# Patient Record
Sex: Female | Born: 1990
Health system: Southern US, Community
[De-identification: ages and names within clinical notes are randomized; demographics above are authoritative.]

## PROBLEM LIST (undated history)

## (undated) DIAGNOSIS — D649 Anemia, unspecified: Secondary | ICD-10-CM

## (undated) HISTORY — PX: TONSILLECTOMY: SUR1361

## (undated) HISTORY — PX: DILATION AND CURETTAGE OF UTERUS: SHX78

## (undated) HISTORY — PX: OTHER SURGICAL HISTORY: SHX169

---
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
## 2010-10-30 ENCOUNTER — Emergency Department (HOSPITAL_COMMUNITY)
Admission: EM | Admit: 2010-10-30 | Discharge: 2010-10-30 | Discharge status: Against Medical Advice | Payer: Self-pay | Attending: Emergency Medicine | Admitting: Emergency Medicine

## 2010-10-30 ENCOUNTER — Emergency Department (HOSPITAL_COMMUNITY)
Admission: EM | Admit: 2010-10-30 | Discharge: 2010-10-31 | Disposition: A | Discharge status: Home / Self Care | Payer: Self-pay | Attending: Emergency Medicine | Admitting: Emergency Medicine

## 2010-10-30 DIAGNOSIS — R110 Nausea: Secondary | ICD-10-CM | POA: Insufficient documentation

## 2010-10-30 DIAGNOSIS — R1031 Right lower quadrant pain: Secondary | ICD-10-CM | POA: Insufficient documentation

## 2010-10-30 DIAGNOSIS — R197 Diarrhea, unspecified: Secondary | ICD-10-CM | POA: Insufficient documentation

## 2010-10-30 DIAGNOSIS — N390 Urinary tract infection, site not specified: Secondary | ICD-10-CM | POA: Insufficient documentation

## 2010-10-30 DIAGNOSIS — K5900 Constipation, unspecified: Secondary | ICD-10-CM | POA: Insufficient documentation

## 2010-10-30 DIAGNOSIS — R10813 Right lower quadrant abdominal tenderness: Secondary | ICD-10-CM | POA: Insufficient documentation

## 2010-10-30 LAB — URINALYSIS, ROUTINE W REFLEX MICROSCOPIC
Bilirubin Urine: NEGATIVE
Glucose, UA: NEGATIVE mg/dL
Ketones, ur: NEGATIVE mg/dL
Nitrite: POSITIVE — AB
Protein, ur: NEGATIVE mg/dL
Specific Gravity, Urine: 1.017 (ref 1.005–1.030)
Urobilinogen, UA: 0.2 mg/dL (ref 0.0–1.0)
pH: 6 (ref 5.0–8.0)

## 2010-10-30 LAB — URINE MICROSCOPIC-ADD ON

## 2010-10-31 ENCOUNTER — Emergency Department (HOSPITAL_COMMUNITY): Payer: Self-pay

## 2010-10-31 ENCOUNTER — Encounter (HOSPITAL_COMMUNITY): Payer: Self-pay | Admitting: Radiology

## 2010-10-31 LAB — COMPREHENSIVE METABOLIC PANEL
ALT: 11 U/L (ref 0–35)
AST: 12 U/L (ref 0–37)
Albumin: 4.2 g/dL (ref 3.5–5.2)
Alkaline Phosphatase: 43 U/L (ref 39–117)
BUN: 14 mg/dL (ref 6–23)
CO2: 25 mEq/L (ref 19–32)
Calcium: 9.3 mg/dL (ref 8.4–10.5)
Chloride: 107 mEq/L (ref 96–112)
Creatinine, Ser: 0.71 mg/dL (ref 0.50–1.10)
GFR calc Af Amer: >60 mL/min (ref >60)
GFR calc non Af Amer: >60 mL/min (ref >60)
Glucose, Bld: 91 mg/dL (ref 70–99)
Potassium: 3.2 mEq/L — ABNORMAL LOW (ref 3.5–5.1)
Sodium: 140 mEq/L (ref 135–145)
Total Bilirubin: 0.4 mg/dL (ref 0.3–1.2)
Total Protein: 6.8 g/dL (ref 6.0–8.3)

## 2010-10-31 LAB — CBC
HCT: 33.2 % — ABNORMAL LOW (ref 36.0–46.0)
Hemoglobin: 11.5 g/dL — ABNORMAL LOW (ref 12.0–15.0)
MCH: 30.2 pg (ref 26.0–34.0)
MCHC: 34.6 g/dL (ref 30.0–36.0)
MCV: 87.1 fL (ref 78.0–100.0)
Platelets: 162 10*3/uL (ref 150–400)
RBC: 3.81 MIL/uL — ABNORMAL LOW (ref 3.87–5.11)
RDW: 13 % (ref 11.5–15.5)
WBC: 6 10*3/uL (ref 4.0–10.5)

## 2010-10-31 LAB — DIFFERENTIAL
Basophils Absolute: 0 10*3/uL (ref 0.0–0.1)
Basophils Relative: 1 % (ref 0–1)
Eosinophils Absolute: 0.1 10*3/uL (ref 0.0–0.7)
Eosinophils Relative: 2 % (ref 0–5)
Lymphocytes Relative: 36 % (ref 12–46)
Lymphs Abs: 2.2 10*3/uL (ref 0.7–4.0)
Monocytes Absolute: 0.4 10*3/uL (ref 0.1–1.0)
Monocytes Relative: 6 % (ref 3–12)
Neutro Abs: 3.3 10*3/uL (ref 1.7–7.7)
Neutrophils Relative %: 55 % (ref 43–77)

## 2010-10-31 LAB — PREGNANCY, URINE: Preg Test, Ur: NEGATIVE

## 2010-10-31 MED ORDER — IOHEXOL 300 MG/ML  SOLN
80.0000 mL | Freq: Once | INTRAMUSCULAR | Status: AC | PRN
  Administered 2010-10-31: 80 mL via INTRAVENOUS

## 2010-12-30 ENCOUNTER — Emergency Department (HOSPITAL_COMMUNITY)
Admission: EM | Admit: 2010-12-30 | Discharge: 2010-12-31 | Disposition: A | Discharge status: Home / Self Care | Payer: Self-pay | Attending: Emergency Medicine | Admitting: Emergency Medicine

## 2010-12-30 ENCOUNTER — Encounter (HOSPITAL_COMMUNITY): Payer: Self-pay | Admitting: Emergency Medicine

## 2010-12-30 DIAGNOSIS — T438X2A Poisoning by other psychotropic drugs, intentional self-harm, initial encounter: Secondary | ICD-10-CM | POA: Insufficient documentation

## 2010-12-30 DIAGNOSIS — F329 Major depressive disorder, single episode, unspecified: Secondary | ICD-10-CM

## 2010-12-30 DIAGNOSIS — F3289 Other specified depressive episodes: Secondary | ICD-10-CM | POA: Insufficient documentation

## 2010-12-30 DIAGNOSIS — T50901A Poisoning by unspecified drugs, medicaments and biological substances, accidental (unintentional), initial encounter: Secondary | ICD-10-CM

## 2010-12-30 DIAGNOSIS — T43294A Poisoning by other antidepressants, undetermined, initial encounter: Secondary | ICD-10-CM | POA: Insufficient documentation

## 2010-12-30 DIAGNOSIS — T43502A Poisoning by unspecified antipsychotics and neuroleptics, intentional self-harm, initial encounter: Secondary | ICD-10-CM | POA: Insufficient documentation

## 2010-12-30 DIAGNOSIS — R1110 Vomiting, unspecified: Secondary | ICD-10-CM | POA: Insufficient documentation

## 2010-12-30 HISTORY — DX: Anemia, unspecified: D64.9

## 2010-12-30 LAB — COMPREHENSIVE METABOLIC PANEL
ALT: 10 U/L (ref 0–35)
AST: 15 U/L (ref 0–37)
Albumin: 4.5 g/dL (ref 3.5–5.2)
Alkaline Phosphatase: 47 U/L (ref 39–117)
BUN: 13 mg/dL (ref 6–23)
CO2: 20 mEq/L (ref 19–32)
Calcium: 9.9 mg/dL (ref 8.4–10.5)
Chloride: 102 mEq/L (ref 96–112)
Creatinine, Ser: 0.77 mg/dL (ref 0.50–1.10)
GFR calc Af Amer: >90 mL/min (ref >90)
GFR calc non Af Amer: >90 mL/min (ref >90)
Glucose, Bld: 99 mg/dL (ref 70–99)
Potassium: 3.7 mEq/L (ref 3.5–5.1)
Sodium: 135 mEq/L (ref 135–145)
Total Bilirubin: 0.4 mg/dL (ref 0.3–1.2)
Total Protein: 7.5 g/dL (ref 6.0–8.3)

## 2010-12-30 LAB — URINALYSIS, ROUTINE W REFLEX MICROSCOPIC
Bilirubin Urine: NEGATIVE
Glucose, UA: NEGATIVE mg/dL
Hgb urine dipstick: NEGATIVE
Leukocytes, UA: NEGATIVE
Nitrite: NEGATIVE
Protein, ur: NEGATIVE mg/dL
Specific Gravity, Urine: 1.027 (ref 1.005–1.030)
Urobilinogen, UA: 0.2 mg/dL (ref 0.0–1.0)
pH: 7.5 (ref 5.0–8.0)

## 2010-12-30 LAB — DIFFERENTIAL
Basophils Absolute: 0 10*3/uL (ref 0.0–0.1)
Basophils Relative: 0 % (ref 0–1)
Eosinophils Absolute: 0 10*3/uL (ref 0.0–0.7)
Eosinophils Relative: 0 % (ref 0–5)
Lymphocytes Relative: 9 % — ABNORMAL LOW (ref 12–46)
Lymphs Abs: 0.6 10*3/uL — ABNORMAL LOW (ref 0.7–4.0)
Monocytes Absolute: 0.2 10*3/uL (ref 0.1–1.0)
Monocytes Relative: 3 % (ref 3–12)
Neutro Abs: 6.2 10*3/uL (ref 1.7–7.7)
Neutrophils Relative %: 88 % — ABNORMAL HIGH (ref 43–77)

## 2010-12-30 LAB — CBC
HCT: 37.1 % (ref 36.0–46.0)
Hemoglobin: 12.7 g/dL (ref 12.0–15.0)
MCH: 30 pg (ref 26.0–34.0)
MCHC: 34.2 g/dL (ref 30.0–36.0)
MCV: 87.5 fL (ref 78.0–100.0)
Platelets: 166 10*3/uL (ref 150–400)
RBC: 4.24 MIL/uL (ref 3.87–5.11)
RDW: 13 % (ref 11.5–15.5)
WBC: 7.1 10*3/uL (ref 4.0–10.5)

## 2010-12-30 LAB — ETHANOL: Alcohol, Ethyl (B): <11 mg/dL (ref 0–11)

## 2010-12-30 LAB — RAPID URINE DRUG SCREEN, HOSP PERFORMED
Amphetamines: NOT DETECTED
Barbiturates: NOT DETECTED
Benzodiazepines: POSITIVE — AB
Cocaine: NOT DETECTED
Opiates: NOT DETECTED
Tetrahydrocannabinol: POSITIVE — AB

## 2010-12-30 LAB — PREGNANCY, URINE: Preg Test, Ur: NEGATIVE

## 2010-12-30 LAB — ACETAMINOPHEN LEVEL: Acetaminophen (Tylenol), Serum: <15 ug/mL (ref 10–30)

## 2010-12-30 LAB — SALICYLATE LEVEL: Salicylate Lvl: <2 mg/dL — ABNORMAL LOW (ref 2.8–20.0)

## 2010-12-30 MED ORDER — IBUPROFEN 200 MG PO TABS: 600.0000 mg | ORAL_TABLET | Freq: Three times a day (TID) | ORAL | Status: DC | PRN

## 2010-12-30 MED ORDER — ALUM & MAG HYDROXIDE-SIMETH 200-200-20 MG/5ML PO SUSP: 30.0000 mL | ORAL | Status: DC | PRN

## 2010-12-30 MED ORDER — SODIUM CHLORIDE 0.9 % IV SOLN
INTRAVENOUS | Status: DC
  Administered 2010-12-30: 22:00:00 via INTRAVENOUS

## 2010-12-30 MED ORDER — ZOLPIDEM TARTRATE 5 MG PO TABS: 10.0000 mg | ORAL_TABLET | Freq: Every evening | ORAL | Status: DC | PRN

## 2010-12-30 MED ORDER — ACETAMINOPHEN 325 MG PO TABS: 650.0000 mg | ORAL_TABLET | ORAL | Status: DC | PRN

## 2010-12-30 MED ORDER — NICOTINE 21 MG/24HR TD PT24: 21.0000 mg | MEDICATED_PATCH | Freq: Every day | TRANSDERMAL | Status: DC

## 2010-12-30 MED ORDER — ONDANSETRON HCL 4 MG/2ML IJ SOLN: 4.0000 mg | INTRAMUSCULAR | Status: DC | PRN

## 2010-12-30 MED ORDER — LORAZEPAM 1 MG PO TABS: 1.0000 mg | ORAL_TABLET | Freq: Three times a day (TID) | ORAL | Status: DC | PRN

## 2010-12-30 MED ORDER — ONDANSETRON HCL 4 MG PO TABS: 4.0000 mg | ORAL_TABLET | Freq: Three times a day (TID) | ORAL | Status: DC | PRN

## 2010-12-30 MED ORDER — SODIUM CHLORIDE 0.9 % IV BOLUS (SEPSIS)
750.0000 mL | Freq: Once | INTRAVENOUS | Status: AC
  Administered 2010-12-30: 750 mL via INTRAVENOUS

## 2010-12-30 NOTE — ED Notes (Signed)
Pt took 20 20mg  paxil at 1245 today attempting to harm self states that she has thought about this but has not done it before, sleepy alert x4, had vomititng since 1330

## 2010-12-30 NOTE — ED Provider Notes (Cosign Needed)
History     CSN: [id number] Arrival date & time: 12/30/2010  6:23 PM   First MD Initiated Contact with Patient 12/30/10 1830      Chief Complaint  Patient presents with  . Drug Overdose    (Consider location/radiation/quality/duration/timing/severity/associated sxs/prior treatment) Patient is a 20 y.o. female presenting with Overdose.  Drug Overdose   Patient relates the summer of 2011 she cheated on her husband and ever since then they've been having some marital difficulty. She relates today they had some minor arguing and he left to go to work. She states about 1245 she took 20 Paxil and 8 Coricidin cough and cold pills which were red in color. She relates about 1:30 she started vomiting. She Her husband at 2 PM and told him what she had done. She relates that time she took the pills she was "hoping to not wake up". But states she really wasn't serious condition is a 32-year-old daughter that she would not want to leave without a mother . She relates she still has nausea she denies abdominal pain. She has a headache indicates her temples and behind her eyes which is her typical headache she's had in the past. She relates this is her first episode of trying to hurt herself. She states she has been depressed only since summer of 2011. She has not sought any counseling or therapy. She also relates other stress at work and also states her mother-in-law has been living with them that has been stressful.  Past Medical History  Diagnosis Date  . Anemia     History reviewed. No pertinent past surgical history.  No family history on file.  History  Substance Use Topics  . Smoking status: Current Everyday Smoker  . Smokeless tobacco: Not on file  . Alcohol Use: Yes   employees Lives with spouse  OB History    Grav Para Term Preterm Abortions TAB SAB Ect Mult Living                  Review of Systems  All other systems reviewed and are negative.    Allergies  Sulfa  antibiotics  Home Medications   Current Outpatient Rx  Name Route Sig Dispense Refill  . NAPROXEN SODIUM 220 MG PO TABS Oral Take 220-440 mg by mouth 2 (two) times daily as needed. For pain.       BP 115/60  Pulse 66  Temp(Src) 99.1 F (37.3 C) (Oral)  Resp 20  SpO2 97% Vital signs normal  Physical Exam  Vitals reviewed. Constitutional: She is oriented to person, place, and time. She appears well-developed and well-nourished.  HENT:  Head: Normocephalic and atraumatic.  Mouth/Throat: Uvula is midline, oropharynx is clear and moist and mucous membranes are normal.  Eyes: Conjunctivae and EOM are normal. Pupils are equal, round, and reactive to light.  Neck: Normal range of motion. Neck supple.  Cardiovascular: Normal rate, regular rhythm and normal heart sounds.   Pulmonary/Chest: Effort normal and breath sounds normal.  Abdominal: Soft. Bowel sounds are normal.  Musculoskeletal: Normal range of motion.       Pt has no acute abnormality.   Neurological: She is alert and oriented to person, place, and time.  Skin: Skin is warm and dry.  Psychiatric:       Affect is flat    ED Course  Procedures (including critical care time)    Results for orders placed during the hospital encounter of 12/30/10  CBC  Component Value Range   WBC 7.1  4.0 - 10.5 (K/uL)   RBC 4.24  3.87 - 5.11 (MIL/uL)   Hemoglobin 12.7  12.0 - 15.0 (g/dL)   HCT 16.1  09.6 - 04.5 (%)   MCV 87.5  78.0 - 100.0 (fL)   MCH 30.0  26.0 - 34.0 (pg)   MCHC 34.2  30.0 - 36.0 (g/dL)   RDW 40.9  81.1 - 91.4 (%)   Platelets 166  150 - 400 (K/uL)  DIFFERENTIAL      Component Value Range   Neutrophils Relative 88 (*) 43 - 77 (%)   Neutro Abs 6.2  1.7 - 7.7 (K/uL)   Lymphocytes Relative 9 (*) 12 - 46 (%)   Lymphs Abs 0.6 (*) 0.7 - 4.0 (K/uL)   Monocytes Relative 3  3 - 12 (%)   Monocytes Absolute 0.2  0.1 - 1.0 (K/uL)   Eosinophils Relative 0  0 - 5 (%)   Eosinophils Absolute 0.0  0.0 - 0.7 (K/uL)    Basophils Relative 0  0 - 1 (%)   Basophils Absolute 0.0  0.0 - 0.1 (K/uL)  COMPREHENSIVE METABOLIC PANEL      Component Value Range   Sodium 135  135 - 145 (mEq/L)   Potassium 3.7  3.5 - 5.1 (mEq/L)   Chloride 102  96 - 112 (mEq/L)   CO2 20  19 - 32 (mEq/L)   Glucose, Bld 99  70 - 99 (mg/dL)   BUN 13  6 - 23 (mg/dL)   Creatinine, Ser 7.82  0.50 - 1.10 (mg/dL)   Calcium 9.9  8.4 - 95.6 (mg/dL)   Total Protein 7.5  6.0 - 8.3 (g/dL)   Albumin 4.5  3.5 - 5.2 (g/dL)   AST 15  0 - 37 (U/L)   ALT 10  0 - 35 (U/L)   Alkaline Phosphatase 47  39 - 117 (U/L)   Total Bilirubin 0.4  0.3 - 1.2 (mg/dL)   GFR calc non Af Amer >90  >90 (mL/min)   GFR calc Af Amer >90  >90 (mL/min)  ETHANOL      Component Value Range   Alcohol, Ethyl (B) <11  0 - 11 (mg/dL)  URINALYSIS, ROUTINE W REFLEX MICROSCOPIC      Component Value Range   Color, Urine YELLOW  YELLOW    Appearance CLOUDY (*) CLEAR    Specific Gravity, Urine 1.027  1.005 - 1.030    pH 7.5  5.0 - 8.0    Glucose, UA NEGATIVE  NEGATIVE (mg/dL)   Hgb urine dipstick NEGATIVE  NEGATIVE    Bilirubin Urine NEGATIVE  NEGATIVE    Ketones, ur TRACE (*) NEGATIVE (mg/dL)   Protein, ur NEGATIVE  NEGATIVE (mg/dL)   Urobilinogen, UA 0.2  0.0 - 1.0 (mg/dL)   Nitrite NEGATIVE  NEGATIVE    Leukocytes, UA NEGATIVE  NEGATIVE   PREGNANCY, URINE      Component Value Range   Preg Test, Ur NEGATIVE    SALICYLATE LEVEL      Component Value Range   Salicylate Lvl <2.0 (*) 2.8 - 20.0 (mg/dL)  ACETAMINOPHEN LEVEL      Component Value Range   Acetaminophen (Tylenol), Serum <15.0  10 - 30 (ug/mL)  URINE RAPID DRUG SCREEN (HOSP PERFORMED)      Component Value Range   Opiates NONE DETECTED  NONE DETECTED    Cocaine NONE DETECTED  NONE DETECTED    Benzodiazepines POSITIVE (*) NONE DETECTED  Amphetamines NONE DETECTED  NONE DETECTED    Tetrahydrocannabinol POSITIVE (*) NONE DETECTED    Barbiturates NONE DETECTED  NONE DETECTED    .  Laboratory  interpretation all normal except for positive urinary drug screen    Course  Patient had IV inserted and was given IV fluids and nausea meds. 20:48 [facility] is aware patient needs evaluaion.    Diagnoses that have been ruled out:  Diagnoses that are still under consideration:  Final diagnoses:  Depression  Overdose  Vomiting   Plan patient is having evaluation by [facility] and recommendations for treatment.     MDM          Ward Givens, MD 12/31/10 [phone]

## 2010-12-30 NOTE — ED Notes (Signed)
Pt sent to the restroom to obtain urine specimen.

## 2010-12-30 NOTE — ED Notes (Signed)
Pt wanded by security.  Pt is tearful.  Significant other remains in room.  Notified to let staff know if family leaving.

## 2010-12-30 NOTE — ED Notes (Signed)
Spoke with Elnita Maxwell at poison control,  Pt should be watched at least until 11 pm for symptoms of overdose,  No medication to be given for binding purpose per poison control,  Check acetaminophen level, can cause diarrhea, nausea, vomiting and tachycardia,  Pt admitted to having numerous episodes of nausea vomiting prior to arrival to ED.    Hr 66 presently, all vitals stable

## 2010-12-30 NOTE — ED Notes (Signed)
Pt placed in blue paper scrubs.  All belongings removed.  Ring and belly ring given to family.  Will notify security to wand patient.  Family remains with patient.  Information given to psych evaluation and med clearance.

## 2010-12-30 NOTE — ED Notes (Addendum)
Sitter is at the pt. Beside, until pt. move to psych ed.

## 2010-12-30 NOTE — ED Notes (Signed)
Pt.s Fiance' left. Left his # for patient to call later in her stay, or for caregivers. [phone]

## 2010-12-31 MED ORDER — ONDANSETRON HCL 4 MG PO TABS: 4.0000 mg | ORAL_TABLET | Freq: Four times a day (QID) | ORAL | Status: AC

## 2010-12-31 NOTE — ED Notes (Signed)
telepsych currently being administered.

## 2010-12-31 NOTE — ED Notes (Signed)
Pt brought back to room 43, introduced self, oriented to unit, pt denies needs at this time, pt denies si/hi/avh. Safety maintained; NAD.
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
## 2010-12-31 NOTE — ED Provider Notes (Signed)
9:42 AM Care from Dr Denton Lank  The patient has been evaluated by [facility] psychiatrist has been cleared to be discharged home from a mental health standpoint.  Clinically and medically she is stable for discharge as well.  She's been recommended to followup with outpatient psychiatric services.  [facility] psychiatrist is now recommending any new medications at this time patient's been instructed to return to the ER for any new or worsening symptoms.  Child psychiatry consultation note in the chart. Consulting physician is Dr Debbora Presto, MD  Holly Co, MD 12/31/10 [phone]

## 2010-12-31 NOTE — ED Notes (Signed)
Family took belongings home

## 2010-12-31 NOTE — ED Notes (Signed)
Breakfast tray delivered
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
## 2010-12-31 NOTE — ED Notes (Addendum)
Previous entry done in error

## 2010-12-31 NOTE — ED Notes (Signed)
Has eaten breakfast, watching TV, hoping to be discharged soon, voicing no complaints, patient is safe

## 2011-09-20 IMAGING — CT CT ABD-PELV W/ CM
1 of 2 series · 15 of 32 positions shown, 19 images · IV contrast (APPLIED)
Comparison: None.

CLINICAL DATA: Right lower quadrant abdominal pain, nausea,
diarrhea and constipation

CT ABDOMEN AND PELVIS WITH CONTRAST
TECHNIQUE: Multidetector CT imaging of the abdomen and pelvis was
performed following the standard protocol during bolus
administration of intravenous contrast.
Contrast: 80mL OMNIPAQUE IOHEXOL 300 MG/ML IV SOLN.

[Series 2: abd/pelv with 5.0 b31f st · axial · 0.61mm/px · z∈[+767,+1162]mm · 15 of 87 slices shown, 19 images]
[im 4/87  soft-tissue]
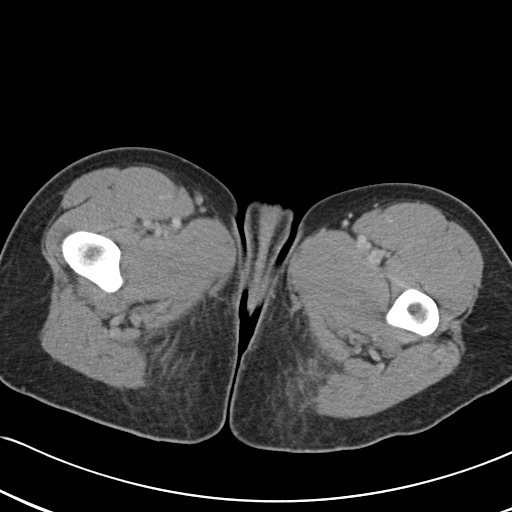
[im 4/87  bone]
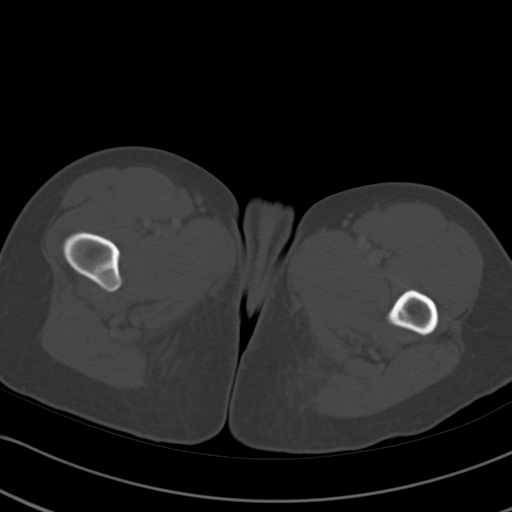
[im 11/87  soft-tissue]
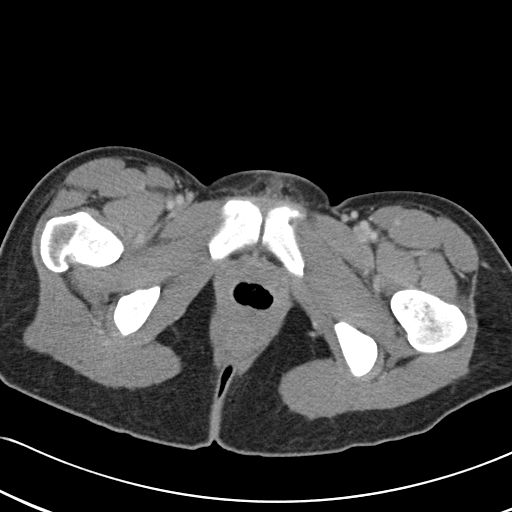
[im 18/87  soft-tissue]
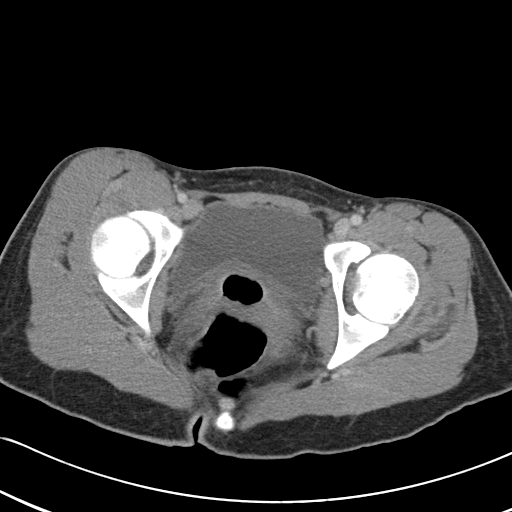
[im 26/87  soft-tissue]
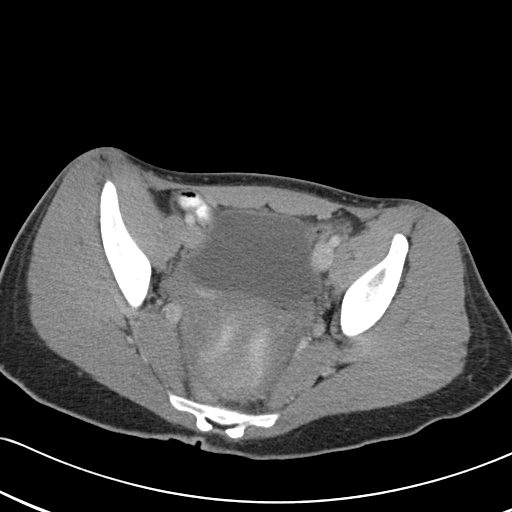
[im 29/87  soft-tissue]
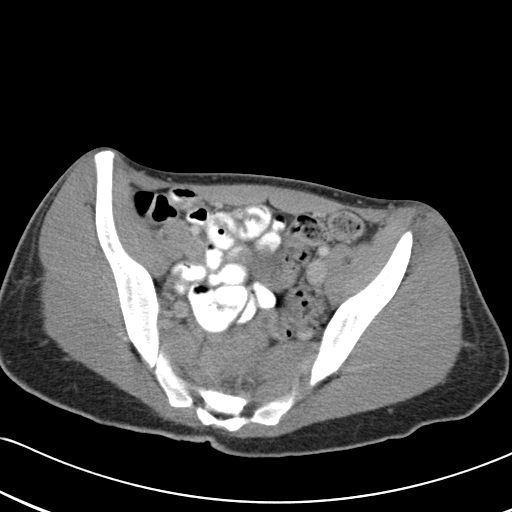
[im 36/87  soft-tissue]
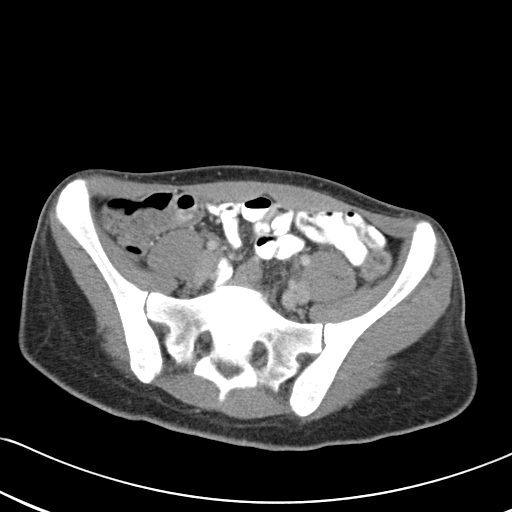
[im 44/87  soft-tissue]
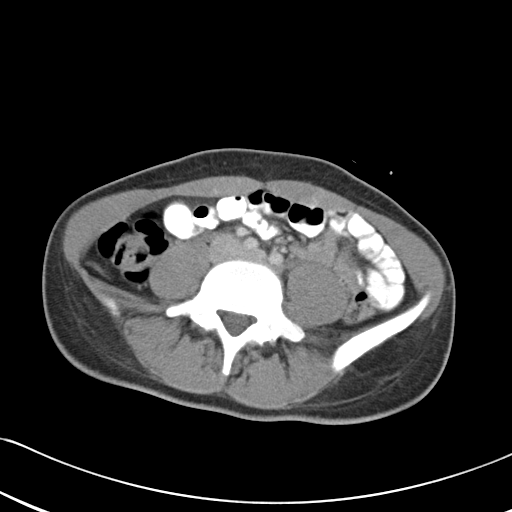
[im 51/87  soft-tissue]
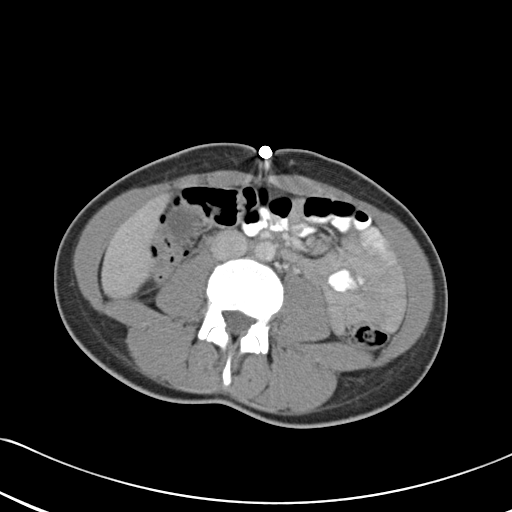
[im 58/87  soft-tissue]
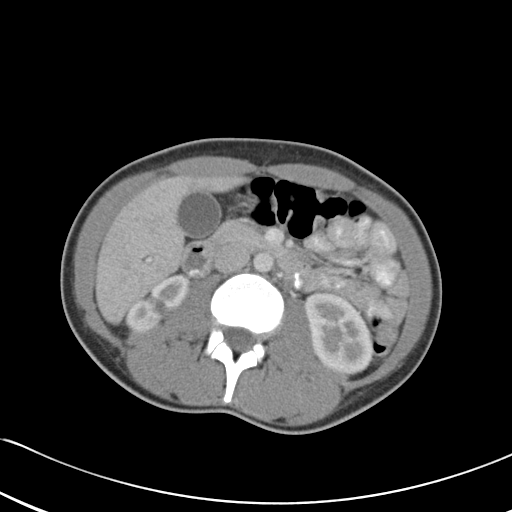
[im 58/87  bone]
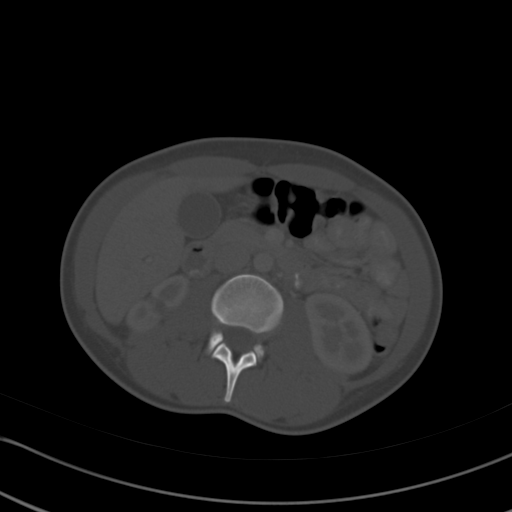
[im 61/87  soft-tissue]
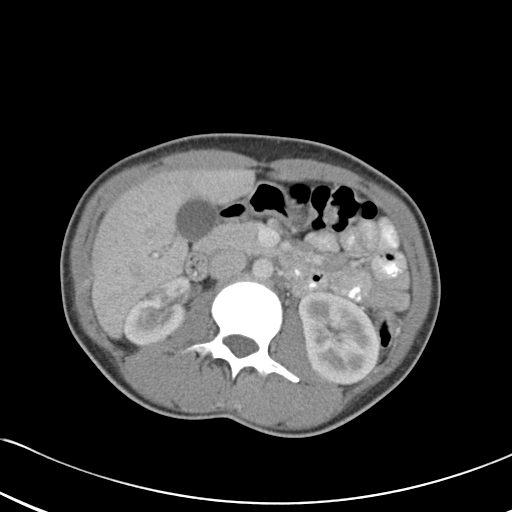
[im 69/87  soft-tissue]
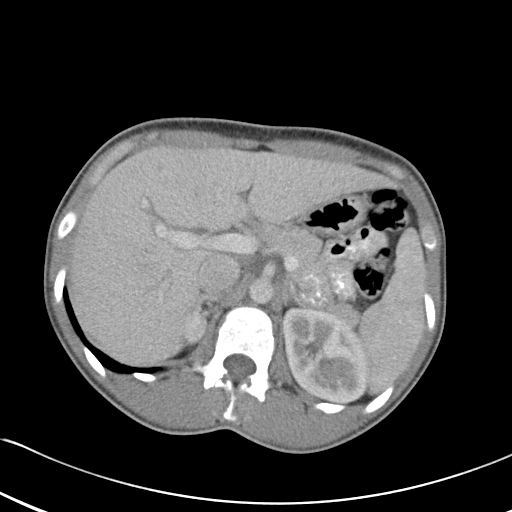
[im 72/87  lung]
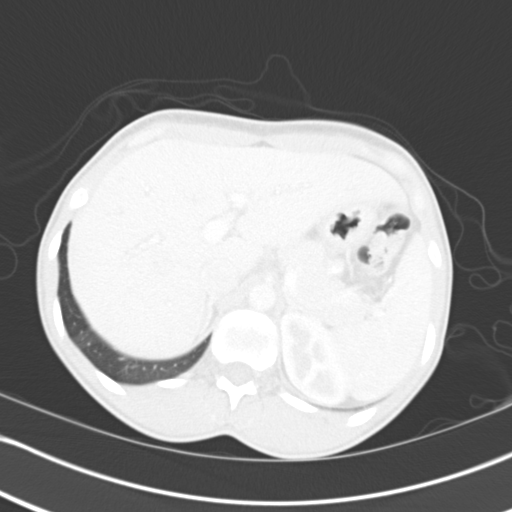
[im 76/87  soft-tissue]
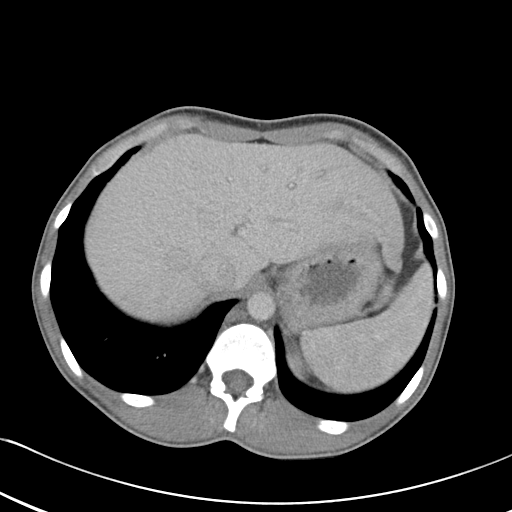
[im 76/87  lung]
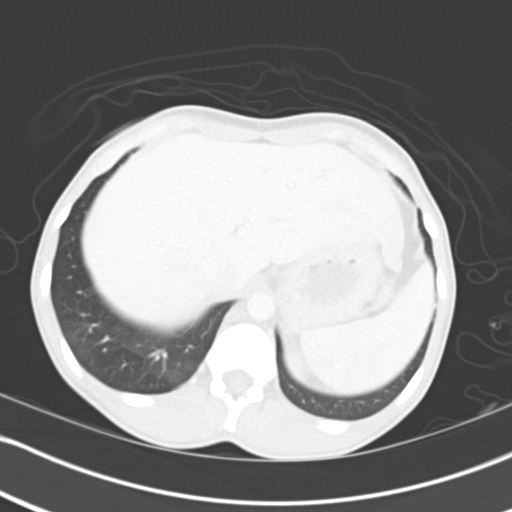
[im 79/87  lung]
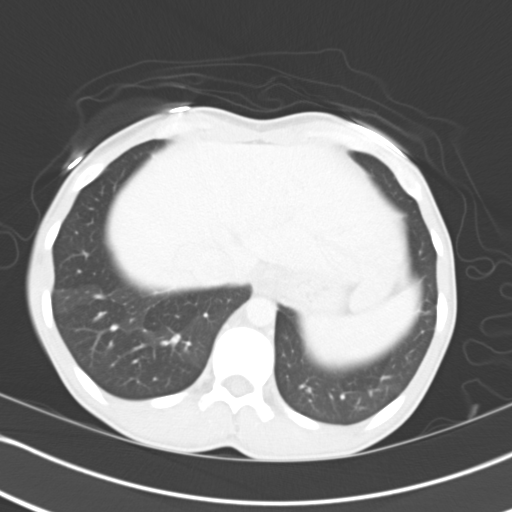
[im 83/87  soft-tissue]
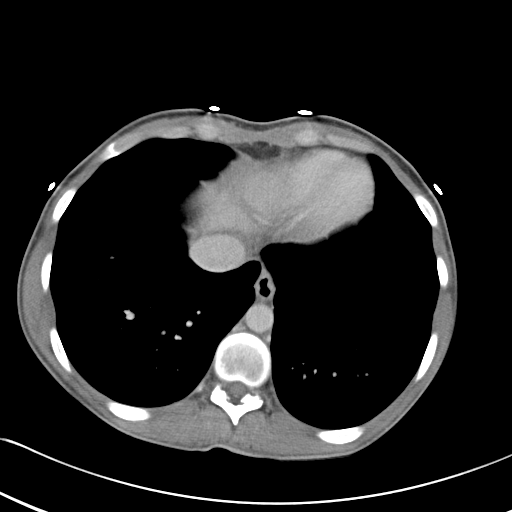
[im 83/87  lung]
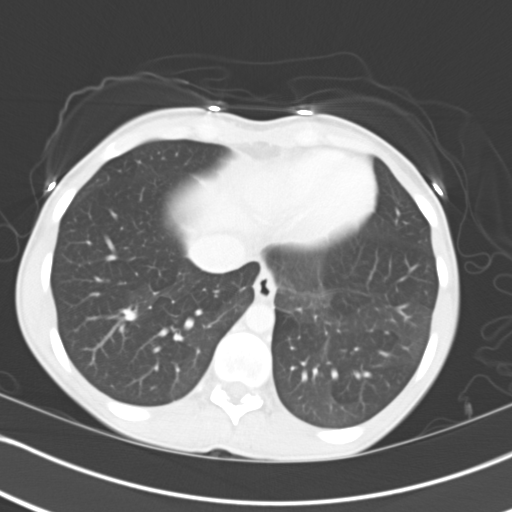

[15 of 32 positions shown; findings below may reference images not displayed]

FINDINGS: Two tiny lung nodules in the right base laterally
measuring less than 4 mm.  In a low risk patient, these are likely
benign.

The liver demonstrates of mild periportal edema pattern which might
represent inflammatory process such as hepatitis versus normal
variation.  No focal liver lesions demonstrated.  Spleen size is
normal.  No bile duct dilatation.  Gallbladder and pancreas are
unremarkable.  No adrenal gland nodules.  The right kidney is
atrophic but demonstrates function.  No solid mass or
hydronephrosis in either kidney.  No retroperitoneal
lymphadenopathy.  Normal caliber abdominal aorta.  Stomach and
small bowel are decompressed.  No free air or free fluid in the
abdomen.

Pelvis:  No free or loculated pelvic fluid collections.  Tampon in
the vagina.  Uterus is not enlarged.  No bladder wall thickening.
The appendix is not specifically identified but no inflammatory
changes demonstrated in the right lower quadrant.  Normal alignment
of the lumbar vertebrae.
IMPRESSION: No acute inflammatory process demonstrated in the abdomen or
pelvis.  Incidental note of a atrophic right kidney.

## 2012-10-25 ENCOUNTER — Encounter: Payer: Self-pay | Admitting: Advanced Practice Midwife

## 2019-02-24 NOTE — L&D Delivery Note (Signed)
OB/GYN Faculty Practice Delivery Note  Holly Bridges is a 29 y.o. [phone] s/p VD of Twin A and breech extraction of Twin B at [redacted]w[redacted]d. She was admitted for preterm labor with severe Pre-eclampsia and possible abruption.   ROM: 1h 31m with clear fluid x2 (AROM of Twin B just prior to delivery) GBS Status: Unknown; received adequate Amp ppx   Maximum Maternal Temperature: 98.44F  Labor Progress: . Initial SVE: 7.5/90/-1. Patient was given BMZ, Magnesium and Amp initiated. She received an epidural. After adequate GBS ppx, AROM performed. She then progressed to complete.   Delivery Date/Time: 4/28 @ [phone] and [phone] Delivery: Once complete, patient pushed twice and delivered Twin A delivered in LOA position. No nuchal cord present. Shoulder and body delivered in usual fashion. Infant with spontaneous cry, placed on mother's abdomen, dried and stimulated. Cord clamped x 2 after 1-minute delay, and cut by FOB and then passed to awaiting NICU team. Cord blood drawn. Twin B found to be in breech presentation. AROM performed and feet grasped. Infant delivered to shoulders and arms swept down and then head easily delivered. Infant was dried and stimulated and cord cut by FOB prior to 1 minute due to poor tone and passed to awaiting NICU team. Arterial cord pH drawn. Cord blood collected. Placentas delivered spontaneously with gentle cord traction. Fundus firm with massage and Pitocin. Labia, perineum, vagina, and cervix inspected inspected with right labial laceration which was repaired with 3-0 Vicryl in a standard fashion. TXA given x2 prior to delivery at ~ 1 hour prior and at delivery. Cytotec placed rectally.  Baby Weights:  A: 1930g B: 1440g Arterial Cord pH Twin B: 7.28  Placenta: Sent to pathology Complications: None Lacerations: Right labial EBL: 521 mL Analgesia: Epidural   Infant:  APGAR (1 MIN):    Rocque, Boy A Kymani [[id number]]  8    Neelie, Welshans [[id number]]  5     APGAR (5 MINS):    Rasheena, Talmadge [[id number]]  [address] [[id number]]  8    Jerilynn Birkenhead, MD [facility] Family Medicine Fellow, [facility] for [facility], [facility] 06/21/2019, 4:24 AM

## 2019-05-10 ENCOUNTER — Ambulatory Visit (INDEPENDENT_AMBULATORY_CARE_PROVIDER_SITE_OTHER): Payer: Self-pay | Admitting: Women's Health

## 2019-05-10 ENCOUNTER — Other Ambulatory Visit (HOSPITAL_COMMUNITY)
Admission: RE | Admit: 2019-05-10 | Discharge: 2019-05-10 | Disposition: A | Discharge status: Home / Self Care | Payer: Medicaid Other | Source: Ambulatory Visit | Attending: Women's Health | Admitting: Women's Health

## 2019-05-10 ENCOUNTER — Encounter: Payer: Self-pay | Admitting: Women's Health

## 2019-05-10 DIAGNOSIS — O30009 Twin pregnancy, unspecified number of placenta and unspecified number of amniotic sacs, unspecified trimester: Secondary | ICD-10-CM | POA: Insufficient documentation

## 2019-05-10 DIAGNOSIS — O0930 Supervision of pregnancy with insufficient antenatal care, unspecified trimester: Secondary | ICD-10-CM | POA: Insufficient documentation

## 2019-05-10 DIAGNOSIS — F1290 Cannabis use, unspecified, uncomplicated: Secondary | ICD-10-CM

## 2019-05-10 DIAGNOSIS — O0990 Supervision of high risk pregnancy, unspecified, unspecified trimester: Secondary | ICD-10-CM | POA: Insufficient documentation

## 2019-05-10 DIAGNOSIS — O99333 Smoking (tobacco) complicating pregnancy, third trimester: Secondary | ICD-10-CM | POA: Insufficient documentation

## 2019-05-10 DIAGNOSIS — O99332 Smoking (tobacco) complicating pregnancy, second trimester: Secondary | ICD-10-CM

## 2019-05-10 DIAGNOSIS — Z8759 Personal history of other complications of pregnancy, childbirth and the puerperium: Secondary | ICD-10-CM

## 2019-05-10 DIAGNOSIS — Z3482 Encounter for supervision of other normal pregnancy, second trimester: Secondary | ICD-10-CM

## 2019-05-10 DIAGNOSIS — O30002 Twin pregnancy, unspecified number of placenta and unspecified number of amniotic sacs, second trimester: Secondary | ICD-10-CM

## 2019-05-10 DIAGNOSIS — Z3A23 23 weeks gestation of pregnancy: Secondary | ICD-10-CM

## 2019-05-10 MED ORDER — ASPIRIN EC 81 MG PO TBEC: 81.0000 mg | DELAYED_RELEASE_TABLET | Freq: Every day | ORAL | 5 refills | Status: DC

## 2019-05-10 MED ORDER — BLOOD PRESSURE MONITOR KIT: PACK | 0 refills | Status: DC

## 2019-05-10 NOTE — Addendum Note (Signed)
Addended by: Cheree Ditto, Dreya Buhrman A on: 05/10/2019 03:43 PM   Modules accepted: Orders

## 2019-05-10 NOTE — Progress Notes (Addendum)
History:   Holly Bridges is a 29 y.o. [phone] at Unknown as patient reports she does not know her previous LMP being seen today for her first obstetrical visit. Patient reports she did have an early [place] at the [facility] and learned she was having twins, but reports they would not tell her how far along she was based on the ultrasound. Patient believes she is about 23-[redacted] weeks pregnant at this time. Her obstetrical history is significant for smoker and marijuana use, retained placenta with D&C 2015, PPROM 2019 '@31wks'$  - delivered '@34wks'$ . Patient does not intend to breast feed. Pregnancy history fully reviewed.  Pt reports this is a desired and planned pregnancy. Allergies: sulfa Current Medications: PNVs PMH: No HTN, DM, asthma. PSH: ear tubes x4, D&C, adenoids & tonsils removed (allergies/ear infections, childhood) OB Hx: retained placenta with D&C 2015, PPROM 2019 '@31wks'$  - delivered '@34wks'$  Social Hx: pt does smoke cigarettes and marijuana. Pt does not drink, or use other drugs. Family Hx: none Pt declines flu vaccine.  Patient reports swollen ankles by afternoon/evening. Pt reports ankles not swollen in AM, only after being on her feet all day.      HISTORY: OB History  Gravida Para Term Preterm AB Living  '4 3 2 1 '$ 0 3  SAB TAB Ectopic Multiple Live Births  0 0 0 0 3    # Outcome Date GA Lbr Len/2nd Weight Sex Delivery Anes PTL Lv  4 Current           3 Preterm 08/08/16   5 lb (2.268 kg) F   Y LIV  2 Term 04/05/13   6 lb (2.722 kg) F   N LIV  1 Term 10/25/05    F   Y LIV    Last pap smear was done 2019 and was normal per patient report, pt unsure if has ever had abnormal pap.  Past Medical History:  Diagnosis Date  . Anemia    History reviewed. No pertinent surgical history. History reviewed. No pertinent family history. Social History   Tobacco Use  . Smoking status: Current Every Day Smoker    Packs/day: 1.00    Years: 5.00    Pack years: 5.00   Types: Cigarettes  Substance Use Topics  . Alcohol use: No  . Drug use: Yes    Frequency: 1.0 times per week    Types: Marijuana   Allergies  Allergen Reactions  . Sulfa Antibiotics    Current Outpatient Medications on File Prior to Visit  Medication Sig Dispense Refill  . naproxen sodium (ANAPROX) 220 MG tablet Take 220-440 mg by mouth 2 (two) times daily as needed. For pain.      No current facility-administered medications on file prior to visit.    Review of Systems Pertinent items noted in HPI and remainder of comprehensive ROS otherwise negative. Physical Exam:   Vitals:   05/10/19 1430  BP: 139/88  Pulse: (!) 118  Weight: 162 lb 9.6 oz (73.8 kg)   Fetal Heart Rate (bpm): 153/156 Uterus:   gravid, FH 27  Pelvic Exam: Perineum: no hemorrhoids, normal perineum   Vulva: normal external genitalia, no lesions   Vagina:  normal mucosa, normal discharge   Cervix: no lesions and normal, pap smear done.    Adnexa: normal adnexa and no mass, fullness, tenderness   Bony Pelvis: average  System: General: well-developed, well-nourished female in no acute distress   Breasts:  pt declines exam   Skin: normal  coloration and turgor, no rashes   Neurologic: oriented, normal, negative, normal mood   Extremities: normal strength, tone, and muscle mass, ROM of all joints is normal   HEENT PERRLA, extraocular movement intact and sclera clear, anicteric   Mouth/Teeth mucous membranes moist, pharynx normal without lesions and dental hygiene good   Neck supple and no masses   Cardiovascular: regular rate and rhythm, murmur heard   Respiratory:  no respiratory distress, normal breath sounds   Abdomen: soft, non-tender; bowel sounds normal; no masses,  no organomegaly  Bedside Ultrasound for twin check: two separate, identifiable fetuses present with HR and fetal movement x2, no separating membrane clearly identified. Patient informed that the ultrasound is considered a limited obstetric  ultrasound and is not intended to be a complete ultrasound exam.  Patient also informed that the ultrasound is not being completed with the intent of assessing for fetal or placental anomalies or any pelvic abnormalities.  Explained that the purpose of today's ultrasound is to assess for fetal heart rate.  Patient acknowledges the purpose of the exam and the limitations of the study.     Assessment:    Pregnancy: [id number] Patient Active Problem List   Diagnosis Date Noted  . Anemia in pregnancy, second trimester 05/11/2019  . Rubella non-immune status, antepartum 05/11/2019  . Heart murmur 05/11/2019  . Supervision of high risk pregnancy, antepartum 05/10/2019  . Tobacco smoking affecting pregnancy in second trimester 05/10/2019  . Uses marijuana 05/10/2019  . Twin pregnancy 05/10/2019  . Late prenatal care 05/10/2019  . History of retained placenta 05/10/2019  . History of preterm premature rupture of membranes (PPROM) 05/10/2019     Plan:    1. Supervision of high risk pregnancy, antepartum - advised elevation of feet, reduction in salt intake and compression socks/stocking for swelling of ankles - [place] MFM OB COMP + 14 WK; Future - Obstetric Panel, Including HIV - Urine Culture - Blood Pressure Monitor KIT; Please check blood pressure 1-2 weeks.  Dispense: 1 kit; Refill: 0 - Cervicovaginal ancillary only( [place]) - Cytology - PAP( [place]) - aspirin EC 81 MG tablet; Take 1 tablet (81 mg total) by mouth daily.  Dispense: 30 tablet; Refill: 5 - Comp Met (CMET) - Protein / creatinine ratio, urine - Genetic Screening -discouraged marijuana and cigarette use in pregnancy, discussed negative sequelae -A1C drawn -baseline PEC labs drawn  2. Tobacco smoking affecting pregnancy in second trimester -encouraged cessation  3. Uses marijuana -encouraged cessation  4. Twin gestation in second trimester, unspecified multiple gestation type - [place] MFM OB DETAIL ADDL GEST +14 WK;  Future - [place] MFM OB DETAIL +14 WK; Future  5. Late prenatal care -NOB at estimated 23-24wks   6. History of retained placenta   7. History of preterm premature rupture of membranes (PPROM)  8. Heart Murmur -will refer to cardiology  Initial labs drawn. Continue prenatal vitamins. Genetic Screening discussed, NIPS: requested. Ultrasound discussed; fetal anatomic survey: requested. Problem list reviewed and updated. The nature of [place] with multiple MDs and other Advanced Practice Providers was explained to patient; also emphasized that residents, students are part of our team. Routine obstetric precautions reviewed. Return in about 2 weeks (around 05/24/2019) for MD ONLY, needs [place] scan with MFM ASAP for anatomy (twins), needs referral to cardiology.   Clarisa Fling, NP  11:22 AM 05/11/2019

## 2019-05-10 NOTE — Patient Instructions (Addendum)
Breastfeeding and Smoking It is not safe to smoke when you are breastfeeding. Smoking during breastfeeding is harmful to you and to your baby in many ways. When you smoke during breastfeeding, nicotine and other harmful (toxic) substances pass through your milk to your baby. When you smoke, your baby is also exposed to cigarette smoke (secondhand smoke) and to surfaces contaminated with the harmful substances in cigarette smoke (thirdhand smoke). The dangers of smoking during breastfeeding apply to using e-cigarettes as well. These also expose your baby to nicotine and other toxic substances. If you are smoking and breastfeeding, do not stop breastfeeding. Breast milk is still the best food for your baby. Take steps to quit smoking. How does this affect me? The effects of smoking are well known. When you smoke, you increase your risk of:  Early death.  Cancer.  Heart disease.  Lung disease.  Stroke.  Eye disease.  Diabetes.  Rheumatoid arthritis.  Osteoporosis. Smoking also affects your ability to breastfeed successfully. Smoking lowers a chemical messenger (a hormone called prolactin) in your body that is important for stimulating the production of breast milk. Smoking during breastfeeding may lead to:  Having trouble breastfeeding.  Stopping breastfeeding early.  Producing breast milk that smells and tastes like a cigarette.  Producing breast milk that has a lower fat content. How does this affect my baby? When you smoke, nicotine and other toxic substances pass through your breast milk directly to your baby. This can affect the development of your baby's brain and body. It can also cause:  Restlessness.  Increased heart rate.  Colic.  Difficulty sleeping.  Sudden infant death syndrome (SIDS).  Difficulty growing. When you smoke or someone else smokes around your baby, your baby is also exposed to secondhand smoke. The effects of secondhand smoke on babies can  include:  SIDS.  Ear infections.  Frequent colds.  Pneumonia.  Bronchitis.  Poor lung development.  Frequent asthma attacks, if your child has asthma. When you or other people smoke in your home, toxic substances from smoke can gradually build up in your hair and clothing as well as in fabric on furniture and drapes. Your baby can be exposed to these substances after touching contaminated surfaces (thirdhand smoke exposure). This may be especially risky for babies because babies put their fingers into their mouth often. A baby's developing brain is also more sensitive to toxins. Follow these instructions at home:   Do not use any products that contain nicotine or tobacco, such as cigarettes and e-cigarettes. If you need help quitting, ask your health care provider.  Take over-the-counter and prescription medicines only as told by your health care provider.  Do not let anyone smoke in your house.  Keep all follow-up visits as told by your health care provider. This is important.  If you are still smoking: ? Do not stop breastfeeding. ? Cut back the amount you smoke and continue trying to stop smoking. ? Smoke outside. ? Smoke after you breastfeed your baby, not before. ? Wash your hands and change your clothes before breastfeeding. Where to find more information  For information on how to quit smoking, go to the smokefree.gov website.  For more information on how smoking affects breastfeeding, turn to the Centers for Disease Control and Prevention: DemocraticPeople.com.cy Contact a health care provider if:  You are smoking while breastfeeding.  You are struggling to stop smoking.  You are having trouble breastfeeding.  Your baby is restless and has trouble sleeping.  Your baby  has a fever, cough, or congestion.  Your baby is not gaining weight. Summary  Smoking when you are breastfeeding is dangerous for you and for your baby.  Your baby may be affected by  nicotine and toxic substances in your breast milk, secondhand smoke, and thirdhand smoke.  Ask your health care provider for help if you are struggling to stop smoking.  Do not stop breastfeeding. Stop smoking. This information is not intended to replace advice given to you by your health care provider. Make sure you discuss any questions you have with your health care provider. Document Revised: 12/06/2018 Document Reviewed: 10/28/2016 Elsevier Patient Education  2020 [employer].   Pregnancy and Smoking Smoking during pregnancy is unhealthy for you and your baby. Smoke from cigarettes, e-cigarettes, pipes, and cigars contains many chemicals that can cause cancer (carcinogens). These products also contain a stimulant drug (nicotine). When you smoke, harmful substances that you breathe in enter your bloodstream and can be passed on to your baby. This can affect your baby's development. If you are planning to become pregnant or have recently become pregnant, talk with your health care provider about quitting smoking. You have a much better chance of having a healthy pregnancy and a healthy baby if you do not smoke while you are pregnant. How does smoking affect me? Smoking increases your risk for many long-term (chronic) diseases. These diseases include cancer, lung diseases, and heart disease. Smoking during pregnancy increases your risk of:  Losing the pregnancy (miscarriage or stillbirth).  Giving birth too early (premature birth).  Pregnancy outside of the uterus (tubal pregnancy).  Problems with the placenta, which is the organ that provides the baby nourishment and oxygen. These problems may include: ? Attachment of the placenta over the opening of the uterus (placenta previa). ? Detachment of the placenta before the baby's birth (placental abruption).  Having your water break before labor begins. How does smoking affect my baby? Before birth Smoking during pregnancy:  Decreases  blood flow and oxygen to your baby.  Increases your baby's risk of birth defects, such as heart defects.  Increases your baby's heart rate.  Slows your baby's growth in the uterus (intrauterine growth retardation). After birth Babies born to women who smoked during pregnancy may:  Have symptoms of nicotine withdrawal.  Be born with a cleft lip, cleft palate, or other facial deformities.  Be too small at birth.  Have a high risk of: ? Serious health problems or lifelong disabilities. These may result in the long-term need for certain medicines, therapies, or other treatments. ? Sudden infant death syndrome (SIDS). Follow these instructions at home:   Do not use any products that contain nicotine or tobacco, such as cigarettes, e-cigarettes, and chewing tobacco. If you need help quitting, ask your health care provider.  Talk with your health care provider about support strategies to quit smoking. Some methods to consider include: ? Counseling (smoking cessation counseling). ? Psychotherapy. ? Acupuncture. ? Hypnosis. ? Telephone hotlines for people trying to quit.  Do not take nicotine supplements or medicine to help you quit smoking unless your health care provider tells you to do so.  Avoid secondhand smoke. Ask people who smoke to avoid smoking around you.  Identify people, places, things, and activities that make you want to smoke (triggers). Avoid them. Where to find more information Learn more about smoking during pregnancy and quitting smoking from:  March of Dimes: www.marchofdimes.org  U.S. Department of Health and Human Services: women.smokefree.gov  American Cancer  Society: www.cancer.org  American Heart Association: www.heart.org  National Cancer Institute: www.cancer.gov For help to quit smoking:  National smoking cessation telephone hotline: 1-800-QUIT NOW (947)503-9592) Contact a health care provider if:  You are struggling to quit smoking.  You are a  smoker and you become pregnant or plan to become pregnant.  You start smoking again after giving birth. Summary  Smoking during pregnancy is unhealthy for you and your baby.  Tobacco smoke contains harmful substances that can affect a baby's health and development.  Smoking increases the risk for serious problems, such as miscarriage, birth defects, or premature birth.  If you need help to quit smoking, talk to your health care provider and ask about support strategies such as counseling. This information is not intended to replace advice given to you by your health care provider. Make sure you discuss any questions you have with your health care provider. Document Revised: 09/09/2018 Document Reviewed: 09/09/2018 Elsevier Patient Education  2020 [employer].    Marijuana Use During Pregnancy and Breastfeeding  Marijuana is the dried leaves, flowers, and stems of the Cannabis sativa or Cannabis indica plant. The plant's active ingredients (cannabinoids), including a chemical called THC, change the chemistry of the brain. Marijuana smoke also has many of the same chemicals as cigarette smoke that cause breathing problems. Marijuana gets into your blood through your lungs when you smoke it and through your digestive system when you swallow it. Using marijuana in any form may be harmful for you and your baby when you are trying to become pregnant and during pregnancy. This includes marijuana that is prescribed to you by a health care provider (medical marijuana). Once marijuana is in your blood, it can travel through your placenta to your baby. It may also pass through breast milk. How does this affect me? Marijuana affects you both mentally and physically. Using marijuana can make you feel high and relaxed. It can also have negative effects, especially at high doses or with long-term use. These include:  Rapid heartbeat and stress on your heart.  Lung irritation and breathing  problems.  Difficulty thinking and making decisions.  Seeing or believing things that are not true (hallucinations and paranoia).  Mood swings, depression, or anxiety.  Decreased ability to learn and remember.  Difficulty getting pregnant. Marijuana can also affect your pregnancy. Not all the effects are known. However, if you use marijuana during pregnancy, you may:  Be less likely to get regular prenatal care and do the things that you need to do to have a healthy pregnancy.  Be more likely to use other drugs that can harm your pregnancy, like drinking alcohol and smoking cigarettes.  Be at higher risk of having your baby die after 28 weeks of pregnancy (stillbirth).  Be at higher risk of giving birth before 37 weeks of pregnancy (premature birth). How does this affect my baby? If you use marijuana during pregnancy, this may affect your baby's development, birth, and life after birth. Your baby may:  Be born prematurely, which can cause physical and mental problems.  Be born with a low birth weight, which can lead to physical and mental problems.  Have problems with brain development.  Have difficulty growing.  Have attention and behavior problems later in life.  Do poorly at school and have learning problems later in life.  Have problems with vision and coordination.  Be at higher risk for using marijuana by age 62. More research is needed to find out exactly how marijuana  affects a baby during breastfeeding. Some studies suggest that the chemicals in marijuana can be passed to a baby through breast milk. To limit possible risks, you should not use marijuana during breastfeeding. Follow these instructions at home:  Let your health care provider know if you use marijuana before trying to get pregnant, during pregnancy, or during breastfeeding.  Do not use marijuana in any form when you are trying to get pregnant, when you are pregnant, or when you are breastfeeding. If  you are having trouble stopping marijuana use, ask your health care provider for help.  Do not smoke. If you need help quitting, ask your health care provider for help.  If you are using medical marijuana, ask your health care provider to switch you to a medicine that is safer to use during pregnancy or breastfeeding.  Keep all your prenatal visits as told by your health care provider. This is important. Where to find more information [employer] on Drug Abuse: www.drugabuse.gov March of Dimes: www.marchofdimes.org/pregnancy Contact a health care provider if:  You use marijuana and want to get pregnant.  You use marijuana during pregnancy or breastfeeding.  You need help stopping marijuana use. Get help right away if:  Your baby is not gaining weight or growing as expected. Summary  Using marijuana in any form may be harmful for you and your baby when you are trying to become pregnant, during pregnancy, and during breastfeeding. This includes marijuana that is prescribed to you (medical marijuana).  Some studies suggest that marijuana may pass through breast milk and can affect your baby's brain development.  Talk to your health care provider if you use marijuana in any form while trying to get pregnant, during pregnancy, or while breastfeeding.  Ask your health care provider for help if you are not able to stop using marijuana. This information is not intended to replace advice given to you by your health care provider. Make sure you discuss any questions you have with your health care provider. Document Revised: 06/03/2018 Document Reviewed: 10/28/2016 Elsevier Patient Education  2020 Elsevier Inc.   Maternity Assessment Unit (MAU)  The Maternity Assessment Unit (MAU) is located at the [facility] at [facility]. The address is: [address], [place], [place], [place] [address]. Please see map below for additional directions.    The  Maternity Assessment Unit is designed to help you during your pregnancy, and for up to 6 weeks after delivery, with any pregnancy- or postpartum-related emergencies, if you think you are in labor, or if your water has broken. For example, if you experience nausea and vomiting, vaginal bleeding, severe abdominal or pelvic pain, elevated blood pressure or other problems related to your pregnancy or postpartum time, please come to the Maternity Assessment Unit for assistance.                      Safe Medications in Pregnancy    Acne: Benzoyl Peroxide Salicylic Acid  Backache/Headache: Tylenol: 2 regular strength every 4 hours OR              2 Extra strength every 6 hours  Colds/Coughs/Allergies: Benadryl (alcohol free) 25 mg every 6 hours as needed Breath right strips Claritin Cepacol throat lozenges Chloraseptic throat spray Cold-Eeze- up to three times per day Cough drops, alcohol free Flonase (by prescription only) Guaifenesin Mucinex Robitussin DM (plain only, alcohol free) Saline nasal spray/drops Sudafed (pseudoephedrine) & Actifed ** use only after [redacted] weeks  gestation and if you do not have high blood pressure Tylenol Vicks Vaporub Zinc lozenges Zyrtec   Constipation: Colace Ducolax suppositories Fleet enema Glycerin suppositories Metamucil Milk of magnesia Miralax Senokot Smooth move tea  Diarrhea: Kaopectate Imodium A-D  *NO pepto Bismol  Hemorrhoids: Anusol Anusol HC Preparation H Tucks  Indigestion: Tums Maalox Mylanta Zantac  Pepcid  Insomnia: Benadryl (alcohol free) 25mg  every 6 hours as needed Tylenol PM Unisom, no Gelcaps  Leg Cramps: Tums MagGel  Nausea/Vomiting:  Bonine Dramamine Emetrol Ginger extract Sea bands Meclizine  Nausea medication to take during pregnancy:  Unisom (doxylamine succinate 25 mg tablets) Take one tablet daily at bedtime. If symptoms are not adequately controlled, the dose can be increased to a  maximum recommended dose of two tablets daily (1/2 tablet in the morning, 1/2 tablet mid-afternoon and one at bedtime). Vitamin B6 100mg  tablets. Take one tablet twice a day (up to 200 mg per day).  Skin Rashes: Aveeno products Benadryl cream or 25mg  every 6 hours as needed Calamine Lotion 1% cortisone cream  Yeast infection: Gyne-lotrimin 7 Monistat 7   **If taking multiple medications, please check labels to avoid duplicating the same active ingredients **take medication as directed on the label ** Do not exceed 4000 mg of tylenol in 24 hours **Do not take medications that contain aspirin or ibuprofen    Preeclampsia and Eclampsia Preeclampsia is a serious condition that may develop during pregnancy. This condition causes high blood pressure and increased protein in your urine along with other symptoms, such as headaches and vision changes. These symptoms may develop as the condition gets worse. Preeclampsia may occur at 20 weeks of pregnancy or later. Diagnosing and treating preeclampsia early is very important. If not treated early, it can cause serious problems for you and your baby. One problem it can lead to is eclampsia. Eclampsia is a condition that causes muscle jerking or shaking (convulsions or seizures) and other serious problems for the mother. During pregnancy, delivering your baby may be the best treatment for preeclampsia or eclampsia. For most women, preeclampsia and eclampsia symptoms go away after giving birth. In rare cases, a woman may develop preeclampsia after giving birth (postpartum preeclampsia). This usually occurs within 48 hours after childbirth but may occur up to 6 weeks after giving birth. What are the causes? The cause of preeclampsia is not known. What increases the risk? The following risk factors make you more likely to develop preeclampsia:  Being pregnant for the first time.  Having had preeclampsia during a past pregnancy.  Having a family  history of preeclampsia.  Having high blood pressure.  Being pregnant with more than one baby.  Being 89 or older.  Being African-American.  Having kidney disease or diabetes.  Having medical conditions such as lupus or blood diseases.  Being very overweight (obese). What are the signs or symptoms? The most common symptoms are:  Severe headaches.  Vision problems, such as blurred or double vision.  Abdominal pain, especially upper abdominal pain. Other symptoms that may develop as the condition gets worse include:  Sudden weight gain.  Sudden swelling of the hands, face, legs, and feet.  Severe nausea and vomiting.  Numbness in the face, arms, legs, and feet.  Dizziness.  Urinating less than usual.  Slurred speech.  Convulsions or seizures. How is this diagnosed? There are no screening tests for preeclampsia. Your health care provider will ask you about symptoms and check for signs of preeclampsia during your prenatal visits. You may  also have tests that include:  Checking your blood pressure.  Urine tests to check for protein. Your health care provider will check for this at every prenatal visit.  Blood tests.  Monitoring your baby's heart rate.  Ultrasound. How is this treated? You and your health care provider will determine the treatment approach that is best for you. Treatment may include:  Having more frequent prenatal exams to check for signs of preeclampsia, if you have an increased risk for preeclampsia.  Medicine to lower your blood pressure.  Staying in the hospital, if your condition is severe. There, treatment will focus on controlling your blood pressure and the amount of fluids in your body (fluid retention).  Taking medicine (magnesium sulfate) to prevent seizures. This may be given as an injection or through an IV.  Taking a low-dose aspirin during your pregnancy.  Delivering your baby early. You may have your labor started with  medicine (induced), or you may have a cesarean delivery. Follow these instructions at home: Eating and drinking   Drink enough fluid to keep your urine pale yellow.  Avoid caffeine. Lifestyle  Do not use any products that contain nicotine or tobacco, such as cigarettes and e-cigarettes. If you need help quitting, ask your health care provider.  Do not use alcohol or drugs.  Avoid stress as much as possible. Rest and get plenty of sleep. General instructions  Take over-the-counter and prescription medicines only as told by your health care provider.  When lying down, lie on your left side. This keeps pressure off your major blood vessels.  When sitting or lying down, raise (elevate) your feet. Try putting some pillows underneath your lower legs.  Exercise regularly. Ask your health care provider what kinds of exercise are best for you.  Keep all follow-up and prenatal visits as told by your health care provider. This is important. How is this prevented? There is no known way of preventing preeclampsia or eclampsia from developing. However, to lower your risk of complications and detect problems early:  Get regular prenatal care. Your health care provider may be able to diagnose and treat the condition early.  Maintain a healthy weight. Ask your health care provider for help managing weight gain during pregnancy.  Work with your health care provider to manage any long-term (chronic) health conditions you have, such as diabetes or kidney problems.  You may have tests of your blood pressure and kidney function after giving birth.  Your health care provider may have you take low-dose aspirin during your next pregnancy. Contact a health care provider if:  You have symptoms that your health care provider told you may require more treatment or monitoring, such as: ? Headaches. ? Nausea or vomiting. ? Abdominal pain. ? Dizziness. ? Light-headedness. Get help right away if:  You  have severe: ? Abdominal pain. ? Headaches that do not get better. ? Dizziness. ? Vision problems. ? Confusion. ? Nausea or vomiting.  You have any of the following: ? A seizure. ? Sudden, rapid weight gain. ? Sudden swelling in your hands, ankles, or face. ? Trouble moving any part of your body. ? Numbness in any part of your body. ? Trouble speaking. ? Abnormal bleeding.  You faint. Summary  Preeclampsia is a serious condition that may develop during pregnancy.  This condition causes high blood pressure and increased protein in your urine along with other symptoms, such as headaches and vision changes.  Diagnosing and treating preeclampsia early is very important. If not  treated early, it can cause serious problems for you and your baby.  Get help right away if you have symptoms that your health care provider told you to watch for. This information is not intended to replace advice given to you by your health care provider. Make sure you discuss any questions you have with your health care provider. Document Revised: 10/12/2017 Document Reviewed: 09/16/2015 Elsevier Patient Education  2020 [employer] Inc.   Pregnancy and Influenza  Influenza, also called the flu, is an infection of the lungs and airways (respiratory tract). If you are pregnant, you are more likely to catch the flu. You are also more likely to have a more serious case of the flu. This is because pregnancy causes changes to your bodys disease-fighting system (immune system), heart, and lungs. If you develop a bad case of the flu, especially with a high fever, this can cause problems for you and your developing baby. How do people get the flu? The flu is caused by a type of germ called a virus. It spreads when virus particles get passed from person to person by:  Being near a sick person who is coughing or sneezing.  Touching something that has the virus on it and then touching your mouth, nose, or face. The  influenza virus is most common during the fall and winter. How can I protect myself against the flu?  Get a flu shot. The best way to prevent the flu is to get a flu shot before flu season starts. The flu shot is not dangerous for your developing baby. It may even help protect your baby from the flu for up to 6 months after birth.  Wash your hands often with soap and warm water. If soap and water are not available, use hand sanitizer.  Do not come in close contact with sick people.  Do not share food, drinks, or utensils with other people.  Avoid touching your eyes, nose, and mouth.  Clean frequently used surfaces at home, school, or work.  Practice healthy lifestyle habits, such as: ? Eating a healthy, balanced diet. ? Drinking plenty of fluids. ? Exercising regularly or as told by your health care provider. ? Sleeping 7-9 hours each night. ? Finding ways to manage stress. What should I do if I have flu symptoms?  If you have any symptoms of the flu, even after getting a flu shot, contact your health care provider right away.  To reduce fever, take over-the-counter acetaminophen as told by your health care provider.  If you have the flu, you may get antiviral medicine to keep the flu from becoming severe and to shorten how long it lasts.  Avoid spreading the flu to others: ? Stay home until you are well. ? Cover your nose and mouth when you cough or sneeze. ? Wash your hands often. Follow these instructions at home:  Take over-the-counter and prescription medicines only as told by your health care provider. Do not take any medicine, including cold or flu medicine, unless your health care provider tells you to do so.  If you were prescribed antiviral medicine, take it as told by your health care provider. Do not stop taking the antiviral medicine even if you start to feel better.  Eat a nutrient-rich diet that includes fresh fruits and vegetables, whole grains, lean protein,  and low-fat dairy.  Drink enough fluid to keep your urine clear or pale yellow.  Get plenty of rest. Contact a health care provider if:  You  have fever or chills.  You have a cough, sore throat, or stuffy nose.  You have worsening or unusual: ? Muscle aches. ? Headache. ? Tiredness. ? Loss of appetite.  You have vomiting or diarrhea. Get help right away if:  You have trouble breathing.  You have chest pain.  You have abdominal pain.  You begin to have labor pains.  You have a fever that does not go down 24 hours after you take medicine.  You do not feel your baby move.  You have diarrhea or vomiting that will not go away.  You have dizziness or confusion.  Your symptoms do not improve, even with treatment. Summary  If you are pregnant, you are more likely to catch the flu. You are also more likely to have a more serious case of the flu.  If you have flu-like symptoms, call your health care provider right away. If you develop a bad case of the flu, especially with a high fever, this can be dangerous for your developing baby.  The best way to prevent the flu is to get a flu shot before flu season starts. The flu shot is not dangerous for your developing baby.  If you have the flu and were prescribed antiviral medicine, take it as told by your health care provider. This information is not intended to replace advice given to you by your health care provider. Make sure you discuss any questions you have with your health care provider. Document Revised: 06/03/2018 Document Reviewed: 04/07/2016 Elsevier Patient Education  2020 [employer]       [employer] Guideline [employer] ([employer]).Twin and Triplet Pregnancy. [place]: [employer] for [employer] and [employer] Excellence ([employer]); 2019.">  Multiple Pregnancy Multiple pregnancy means that a woman is carrying more than one baby at a time. She may be pregnant with twins, triplets, or more. The majority of multiple pregnancies are  twins. Naturally conceiving triplets or more (higher-order multiples) is rare. Multiple pregnancies are riskier than single pregnancies. A woman with a multiple pregnancy is more likely to have certain problems during her pregnancy. How does a multiple pregnancy happen? A multiple pregnancy happens when:  The woman's body releases more than one egg at a time, and then each egg gets fertilized by a different sperm. ? This is the most common type of multiple pregnancy. ? Twins or other multiples produced this way are called fraternal. They are no more alike than non-multiple siblings are.  One sperm fertilizes one egg, which then divides into more than one embryo. ? Twins or other multiples produced this way are called identical. Identical multiples are always the same gender, and they look very much alike. Who is most likely to have a multiple pregnancy? A multiple pregnancy is more likely to develop in women who:  Have had fertility treatment, especially if the treatment included fertility medicines.  Are older than 31 years of age.  Have already had four or more children.  Have a family history of multiple pregnancy. How is a multiple pregnancy diagnosed? A multiple pregnancy may be diagnosed based on:  Symptoms such as: ? Rapid weight gain in the first 3 months of pregnancy (first trimester). ? More severe nausea and breast tenderness than what is typical of a single pregnancy. ? A larger uterus than what is normal for the stage of the pregnancy.  Blood tests that detect a higher-than-normal level of human chorionic gonadotropin (hCG). This is a hormone that your body produces in early pregnancy.  An  ultrasound exam. This is used to confirm that you are carrying multiples. What risks come with multiple pregnancy? A multiple pregnancy puts you at a higher risk for certain problems during or after your pregnancy. These include:  Delivering your babies before your due date (preterm  birth). A full-term pregnancy lasts for at least 37 weeks. ? Babies born before 62 weeks may have a higher risk for breathing problems, feeding difficulties, cerebral palsy, and learning disabilities.  Diabetes.  Preeclampsia. This is a serious condition that causes high blood pressure and headaches during pregnancy.  Too much blood loss after childbirth (postpartum hemorrhage).  Postpartum depression.  Low birth weight of the babies. How will having a multiple pregnancy affect my care? Your health care team will monitor you more closely. You may need more frequent prenatal visits. This will ensure that you are healthy and that your babies are growing normally. Follow these instructions at home: Eating and drinking  Increase your nutrition. ? Follow your health care provider's recommendations for weight gain. You may need to gain a little extra weight when you are pregnant with multiples. ? Eat healthy snacks often throughout the day. This will add calories and reduce nausea.  Drink enough fluid to keep your urine pale yellow.  Take prenatal vitamins. Ask your health care provider what vitamins are right for you. Activity Limit your activities by 20-24 weeks of pregnancy.  Rest often.  Avoid activities, exercise, and work that take a lot of effort.  Ask your health care provider when you should stop having sex. General instructions  Do not use any products that contain nicotine or tobacco, such as cigarettes, e-cigarettes, and chewing tobacco. If you need help quitting, ask your health care provider.  Do not drink alcohol or use illegal drugs.  Take over-the-counter and prescription medicines only as told by your health care provider.  Arrange for extra help around the house.  Keep all follow-up visits and all prenatal visits as told by your health care provider. This is important. Where to find more information  [employer]:  www.acog.org Contact a health care provider if:  You have dizziness.  You have nausea, vomiting, or diarrhea that does not go away.  You have depression or other emotions that are interfering with your normal activities.  You have a fever.  You have pain with urination.  You have a bad-smelling vaginal discharge.  You notice increased swelling in your face, hands, legs, or ankles. Get help right away if:  You have fluid leaking from your vagina.  You have bleeding from your vagina.  You have pelvic cramps, pelvic pressure, or nagging pain in your abdomen or lower back.  You are having regular contractions.  You have a severe headache, with or without changes in how you see.  You have chest pain or shortness of breath.  You notice that your babies move less often, or do not move at all. Summary  Having a multiple pregnancy means that a woman is carrying more than one baby at a time.  A multiple pregnancy puts you at a higher risk for delivering your babies before your due date, having diabetes, preeclampsia, too much blood loss after childbirth, or low birth weight of the babies.  Your health care provider will monitor you more closely during your pregnancy.  You may need to make some lifestyle changes during pregnancy. This includes eating more, limiting your activities after 20-24 weeks of pregnancy, and arranging for extra  help around the house.  Follow up with your health care provider as instructed if you experience any complications. This information is not intended to replace advice given to you by your health care provider. Make sure you discuss any questions you have with your health care provider. Document Revised: 10/03/2018 Document Reviewed: 10/03/2018 Elsevier Patient Education  2020 [employer].

## 2019-05-11 ENCOUNTER — Telehealth: Payer: Self-pay | Admitting: Women's Health

## 2019-05-11 ENCOUNTER — Other Ambulatory Visit: Payer: Self-pay | Admitting: Women's Health

## 2019-05-11 ENCOUNTER — Encounter: Payer: Self-pay | Admitting: Women's Health

## 2019-05-11 DIAGNOSIS — R011 Cardiac murmur, unspecified: Secondary | ICD-10-CM

## 2019-05-11 DIAGNOSIS — Z2839 Other underimmunization status: Secondary | ICD-10-CM | POA: Insufficient documentation

## 2019-05-11 DIAGNOSIS — O9081 Anemia of the puerperium: Secondary | ICD-10-CM | POA: Insufficient documentation

## 2019-05-11 DIAGNOSIS — Z283 Underimmunization status: Secondary | ICD-10-CM | POA: Insufficient documentation

## 2019-05-11 LAB — COMPREHENSIVE METABOLIC PANEL
ALT: 7 IU/L (ref 0–32)
AST: 12 IU/L (ref 0–40)
Albumin/Globulin Ratio: 1.2 (ref 1.2–2.2)
Albumin: 3.2 g/dL — ABNORMAL LOW (ref 3.9–5.0)
Alkaline Phosphatase: 122 IU/L — ABNORMAL HIGH (ref 39–117)
BUN/Creatinine Ratio: 10 (ref 9–23)
BUN: 10 mg/dL (ref 6–20)
Bilirubin Total: <0.2 mg/dL (ref 0.0–1.2)
CO2: 22 mmol/L (ref 20–29)
Calcium: 8 mg/dL — ABNORMAL LOW (ref 8.7–10.2)
Chloride: 105 mmol/L (ref 96–106)
Creatinine, Ser: 1 mg/dL (ref 0.57–1.00)
GFR calc Af Amer: 87 mL/min/{1.73_m2} (ref >59)
GFR calc non Af Amer: 76 mL/min/{1.73_m2} (ref >59)
Globulin, Total: 2.7 g/dL (ref 1.5–4.5)
Glucose: 81 mg/dL (ref 65–99)
Potassium: 3.8 mmol/L (ref 3.5–5.2)
Sodium: 139 mmol/L (ref 134–144)
Total Protein: 5.9 g/dL — ABNORMAL LOW (ref 6.0–8.5)

## 2019-05-11 LAB — OBSTETRIC PANEL, INCLUDING HIV
Antibody Screen: NEGATIVE
Basophils Absolute: 0 10*3/uL (ref 0.0–0.2)
Basos: 1 %
EOS (ABSOLUTE): 0.1 10*3/uL (ref 0.0–0.4)
Eos: 1 %
HIV Screen 4th Generation wRfx: NONREACTIVE
Hematocrit: 18.2 % — ABNORMAL LOW (ref 34.0–46.6)
Hemoglobin: 5.6 g/dL — CL (ref 11.1–15.9)
Hepatitis B Surface Ag: NEGATIVE
Immature Grans (Abs): 0.1 10*3/uL (ref 0.0–0.1)
Immature Granulocytes: 1 %
Lymphocytes Absolute: 1.9 10*3/uL (ref 0.7–3.1)
Lymphs: 21 %
MCH: 22.7 pg — ABNORMAL LOW (ref 26.6–33.0)
MCHC: 30.8 g/dL — ABNORMAL LOW (ref 31.5–35.7)
MCV: 74 fL — ABNORMAL LOW (ref 79–97)
Monocytes Absolute: 0.4 10*3/uL (ref 0.1–0.9)
Monocytes: 4 %
Neutrophils Absolute: 6.4 10*3/uL (ref 1.4–7.0)
Neutrophils: 72 %
Platelets: 260 10*3/uL (ref 150–450)
RBC: 2.47 x10E6/uL — CL (ref 3.77–5.28)
RDW: 16.8 % — ABNORMAL HIGH (ref 11.7–15.4)
RPR Ser Ql: NONREACTIVE
Rh Factor: POSITIVE
Rubella Antibodies, IGG: <0.9 index — ABNORMAL LOW (ref >0.99)
WBC: 8.8 10*3/uL (ref 3.4–10.8)

## 2019-05-11 LAB — PROTEIN / CREATININE RATIO, URINE
Creatinine, Urine: 97.6 mg/dL
Protein, Ur: 16.6 mg/dL
Protein/Creat Ratio: 170 mg/g creat (ref 0–200)

## 2019-05-11 LAB — CERVICOVAGINAL ANCILLARY ONLY
Chlamydia: NEGATIVE
Comment: NEGATIVE
Comment: NEGATIVE
Comment: NORMAL
Neisseria Gonorrhea: NEGATIVE
Trichomonas: NEGATIVE

## 2019-05-11 LAB — HEMOGLOBIN A1C
Est. average glucose Bld gHb Est-mCnc: 103 mg/dL
Hgb A1c MFr Bld: 5.2 % (ref 4.8–5.6)

## 2019-05-11 NOTE — Progress Notes (Signed)
Referral to cardiology entered for heart murmur.

## 2019-05-11 NOTE — Telephone Encounter (Signed)
Attempted to call patient re: critical hgb of 5.6. Discussed with Dr. Debroah Loop who will admit patient for blood transfusion to [facility]. Called patient phone number in chart, patient did not answer and voicemail full - unable to leave message. Attempted to call mother's phone number listed in chart, no answer, no voicemail left as there was no personal greeting to ensure this was the correct individual. Will attempt to call again later today.  Marylen Ponto, NP  10:42 AM 05/11/2019

## 2019-05-11 NOTE — Telephone Encounter (Signed)
Second attempt to call patient re: critical hgb of 5.6. Discussed with Dr. Debroah Loop who will admit patient for blood transfusion to [facility]. Called patient phone number in chart, patient did not answer and voicemail full - unable to leave message. Attempted to call mother's phone number listed in chart, no answer, no voicemail left as there was no personal greeting to ensure this was the correct individual. Will send message to clinical staff at [facility] to attempt to call patient tomorrow.  Marylen Ponto, NP  2:03 PM 05/11/2019

## 2019-05-12 ENCOUNTER — Encounter: Payer: Self-pay | Admitting: Women's Health

## 2019-05-12 DIAGNOSIS — R87810 Cervical high risk human papillomavirus (HPV) DNA test positive: Secondary | ICD-10-CM | POA: Insufficient documentation

## 2019-05-12 DIAGNOSIS — R87610 Atypical squamous cells of undetermined significance on cytologic smear of cervix (ASC-US): Secondary | ICD-10-CM | POA: Insufficient documentation

## 2019-05-12 LAB — CYTOLOGY - PAP
Comment: NEGATIVE
Comment: NEGATIVE
Comment: NEGATIVE
Diagnosis: UNDETERMINED — AB
HPV 16: NEGATIVE
HPV 18 / 45: NEGATIVE
High risk HPV: POSITIVE — AB

## 2019-05-12 NOTE — Progress Notes (Signed)
This patient needs a colposcopy. If we can reach her by phone about her hemoglobin, please have her scheduled for this as well. Thank you! Joni Reining

## 2019-05-15 ENCOUNTER — Telehealth: Payer: Self-pay | Admitting: Women's Health

## 2019-05-15 ENCOUNTER — Observation Stay (HOSPITAL_COMMUNITY)
Admission: AD | Admit: 2019-05-15 | Discharge: 2019-05-16 | Discharge status: Against Medical Advice | Payer: Medicaid Other | Attending: Obstetrics and Gynecology | Admitting: Obstetrics and Gynecology

## 2019-05-15 ENCOUNTER — Other Ambulatory Visit: Payer: Self-pay

## 2019-05-15 ENCOUNTER — Encounter (HOSPITAL_COMMUNITY): Payer: Self-pay | Admitting: Obstetrics and Gynecology

## 2019-05-15 DIAGNOSIS — O99323 Drug use complicating pregnancy, third trimester: Secondary | ICD-10-CM | POA: Diagnosis not present

## 2019-05-15 DIAGNOSIS — O26893 Other specified pregnancy related conditions, third trimester: Secondary | ICD-10-CM | POA: Insufficient documentation

## 2019-05-15 DIAGNOSIS — R030 Elevated blood-pressure reading, without diagnosis of hypertension: Secondary | ICD-10-CM | POA: Diagnosis not present

## 2019-05-15 DIAGNOSIS — Z3A28 28 weeks gestation of pregnancy: Secondary | ICD-10-CM | POA: Diagnosis not present

## 2019-05-15 DIAGNOSIS — Z20822 Contact with and (suspected) exposure to covid-19: Secondary | ICD-10-CM | POA: Diagnosis not present

## 2019-05-15 DIAGNOSIS — F17210 Nicotine dependence, cigarettes, uncomplicated: Secondary | ICD-10-CM | POA: Insufficient documentation

## 2019-05-15 DIAGNOSIS — D649 Anemia, unspecified: Secondary | ICD-10-CM | POA: Insufficient documentation

## 2019-05-15 DIAGNOSIS — F1290 Cannabis use, unspecified, uncomplicated: Secondary | ICD-10-CM | POA: Insufficient documentation

## 2019-05-15 DIAGNOSIS — O30043 Twin pregnancy, dichorionic/diamniotic, third trimester: Secondary | ICD-10-CM | POA: Diagnosis not present

## 2019-05-15 DIAGNOSIS — O0933 Supervision of pregnancy with insufficient antenatal care, third trimester: Principal | ICD-10-CM | POA: Insufficient documentation

## 2019-05-15 DIAGNOSIS — O30009 Twin pregnancy, unspecified number of placenta and unspecified number of amniotic sacs, unspecified trimester: Secondary | ICD-10-CM

## 2019-05-15 DIAGNOSIS — O99334 Smoking (tobacco) complicating childbirth: Secondary | ICD-10-CM | POA: Insufficient documentation

## 2019-05-15 LAB — COMPREHENSIVE METABOLIC PANEL
ALT: 11 U/L (ref 0–44)
AST: 15 U/L (ref 15–41)
Albumin: 2.2 g/dL — ABNORMAL LOW (ref 3.5–5.0)
Alkaline Phosphatase: 108 U/L (ref 38–126)
Anion gap: 8 (ref 5–15)
BUN: 11 mg/dL (ref 6–20)
CO2: 24 mmol/L (ref 22–32)
Calcium: 7.6 mg/dL — ABNORMAL LOW (ref 8.9–10.3)
Chloride: 105 mmol/L (ref 98–111)
Creatinine, Ser: 0.84 mg/dL (ref 0.44–1.00)
GFR calc Af Amer: >60 mL/min (ref >60)
GFR calc non Af Amer: >60 mL/min (ref >60)
Glucose, Bld: 82 mg/dL (ref 70–99)
Potassium: 3.2 mmol/L — ABNORMAL LOW (ref 3.5–5.1)
Sodium: 137 mmol/L (ref 135–145)
Total Bilirubin: 0.4 mg/dL (ref 0.3–1.2)
Total Protein: 5.4 g/dL — ABNORMAL LOW (ref 6.5–8.1)

## 2019-05-15 LAB — SARS CORONAVIRUS 2 (TAT 6-24 HRS): SARS Coronavirus 2: NEGATIVE

## 2019-05-15 LAB — URINE CULTURE

## 2019-05-15 LAB — ABO/RH: ABO/RH(D): A POS

## 2019-05-15 LAB — PREPARE RBC (CROSSMATCH)

## 2019-05-15 MED ORDER — CALCIUM CARBONATE ANTACID 500 MG PO CHEW
2.0000 | CHEWABLE_TABLET | ORAL | Status: DC | PRN
  Administered 2019-05-15: 400 mg via ORAL
  Filled 2019-05-15: qty 2

## 2019-05-15 MED ORDER — DOCUSATE SODIUM 100 MG PO CAPS: 100.0000 mg | ORAL_CAPSULE | Freq: Every day | ORAL | Status: DC

## 2019-05-15 MED ORDER — ACETAMINOPHEN 325 MG PO TABS
650.0000 mg | ORAL_TABLET | Freq: Once | ORAL | Status: AC
  Administered 2019-05-15: 650 mg via ORAL
  Filled 2019-05-15: qty 2

## 2019-05-15 MED ORDER — LABETALOL HCL 200 MG PO TABS
200.0000 mg | ORAL_TABLET | Freq: Two times a day (BID) | ORAL | Status: DC
  Administered 2019-05-15: 200 mg via ORAL
  Filled 2019-05-15 (×2): qty 1

## 2019-05-15 MED ORDER — ACETAMINOPHEN 325 MG PO TABS: 650.0000 mg | ORAL_TABLET | ORAL | Status: DC | PRN

## 2019-05-15 MED ORDER — ASPIRIN EC 81 MG PO TBEC
81.0000 mg | DELAYED_RELEASE_TABLET | Freq: Every day | ORAL | Status: DC
  Administered 2019-05-15: 81 mg via ORAL
  Filled 2019-05-15: qty 1

## 2019-05-15 MED ORDER — PRENATAL MULTIVITAMIN CH: 1.0000 | ORAL_TABLET | Freq: Every day | ORAL | Status: DC

## 2019-05-15 MED ORDER — DIPHENHYDRAMINE HCL 25 MG PO CAPS
25.0000 mg | ORAL_CAPSULE | Freq: Once | ORAL | Status: AC
  Administered 2019-05-15: 25 mg via ORAL
  Filled 2019-05-15: qty 1

## 2019-05-15 MED ORDER — SODIUM CHLORIDE 0.9% IV SOLUTION: Freq: Once | INTRAVENOUS | Status: AC

## 2019-05-15 NOTE — Telephone Encounter (Signed)
Patient returned call, notified her of need for blood transfusion.  She will get ready and head to the hospital.  Patient states it will take her two hours to get ready and secure childcare.

## 2019-05-15 NOTE — Telephone Encounter (Signed)
Attempted to reach patient regarding her low Hgb.  No answer to patient, voicemail is full.  Tried to reach emergency contact, no answer but was able to leave voicemail.

## 2019-05-15 NOTE — H&P (Signed)
Holly Bridges is a 29 y.o. female presenting for scheduled admission for anemia of pregnancy. Patient of unknown gestational age but approximately 23-24 weeks with twin pregnancy. Patient reports feeling well and is without complaints. She denies any vaginal bleeding, leakage of fluid or contractions. She reports good fetal movement. She denies chest pain, SOB, lightheadedness/dizziness. Patient reports a mild headache without visual changes or RUQ/epigastric pain.  OB History    Gravida  4   Para  3   Term  2   Preterm  1   AB      Living  3     SAB      TAB      Ectopic      Multiple      Live Births  3          Past Medical History:  Diagnosis Date  . Anemia    No past surgical history on file. Family History: family history is not on file. Social History:  reports that she has been smoking cigarettes. She has a 5.00 pack-year smoking history. She does not have any smokeless tobacco history on file. She reports current drug use. Frequency: 1.00 time per week. Drug: Marijuana. She reports that she does not drink alcohol.       Review of Systems History  See pertinent in HPI. All other systems reviewed and negative    Blood pressure (!) 150/102, pulse 96, temperature 98.8 F (37.1 C), temperature source Oral, resp. rate 16, height 5\' 4"  (1.626 m), last menstrual period 10/25/2018. Exam Physical Exam  GENERAL: Well-developed, well-nourished female in no acute distress.  LUNGS: Clear to auscultation bilaterally.  HEART: Regular rate and rhythm. ABDOMEN: Soft, nontender, gravid PELVIC: Not indicated EXTREMITIES: No cyanosis, clubbing, or edema, 2+ distal pulses.  Prenatal labs: ABO, Rh: A/Positive/-- (03/17 1541) Antibody: Negative (03/17 1541) Rubella: <0.90 (03/17 1541) RPR: Non Reactive (03/17 1541)  HBsAg: Negative (03/17 1541)  HIV: Non Reactive (03/17 1541)  GBS:     Assessment/Plan: 29 yo P2103 with twin pregnancy in late second or early  third trimester with anemia of pregnancy - Patient with Hg 5.6 on 3/17. Discussed blood transfusion and patient agreed. Will transfuse 3 units - Anatomy/dating ultrasound today - Patient with elevated BP on admission. Will monitor closely. PIH labs ordered   Holly Bridges 05/15/2019, 3:35 PM

## 2019-05-16 ENCOUNTER — Observation Stay (HOSPITAL_BASED_OUTPATIENT_CLINIC_OR_DEPARTMENT_OTHER): Payer: Medicaid Other

## 2019-05-16 DIAGNOSIS — D649 Anemia, unspecified: Secondary | ICD-10-CM

## 2019-05-16 DIAGNOSIS — O99333 Smoking (tobacco) complicating pregnancy, third trimester: Secondary | ICD-10-CM

## 2019-05-16 DIAGNOSIS — O30043 Twin pregnancy, dichorionic/diamniotic, third trimester: Secondary | ICD-10-CM

## 2019-05-16 DIAGNOSIS — Z20822 Contact with and (suspected) exposure to covid-19: Secondary | ICD-10-CM | POA: Diagnosis not present

## 2019-05-16 DIAGNOSIS — O99013 Anemia complicating pregnancy, third trimester: Secondary | ICD-10-CM

## 2019-05-16 DIAGNOSIS — O99323 Drug use complicating pregnancy, third trimester: Secondary | ICD-10-CM | POA: Diagnosis not present

## 2019-05-16 DIAGNOSIS — O0933 Supervision of pregnancy with insufficient antenatal care, third trimester: Secondary | ICD-10-CM | POA: Diagnosis not present

## 2019-05-16 DIAGNOSIS — Z3A29 29 weeks gestation of pregnancy: Secondary | ICD-10-CM

## 2019-05-16 DIAGNOSIS — O26893 Other specified pregnancy related conditions, third trimester: Secondary | ICD-10-CM | POA: Diagnosis not present

## 2019-05-16 DIAGNOSIS — F17210 Nicotine dependence, cigarettes, uncomplicated: Secondary | ICD-10-CM | POA: Diagnosis not present

## 2019-05-16 DIAGNOSIS — R030 Elevated blood-pressure reading, without diagnosis of hypertension: Secondary | ICD-10-CM | POA: Diagnosis not present

## 2019-05-16 DIAGNOSIS — Z3A28 28 weeks gestation of pregnancy: Secondary | ICD-10-CM | POA: Diagnosis not present

## 2019-05-16 DIAGNOSIS — O99334 Smoking (tobacco) complicating childbirth: Secondary | ICD-10-CM | POA: Diagnosis not present

## 2019-05-16 DIAGNOSIS — O30042 Twin pregnancy, dichorionic/diamniotic, second trimester: Secondary | ICD-10-CM

## 2019-05-16 DIAGNOSIS — Z3687 Encounter for antenatal screening for uncertain dates: Secondary | ICD-10-CM

## 2019-05-16 DIAGNOSIS — O365932 Maternal care for other known or suspected poor fetal growth, third trimester, fetus 2: Secondary | ICD-10-CM

## 2019-05-16 DIAGNOSIS — O09213 Supervision of pregnancy with history of pre-term labor, third trimester: Secondary | ICD-10-CM

## 2019-05-16 LAB — TYPE AND SCREEN
ABO/RH(D): A POS
Antibody Screen: NEGATIVE
Unit division: 0
Unit division: 0
Unit division: 0

## 2019-05-16 LAB — BPAM RBC
Blood Product Expiration Date: 202104192359
Blood Product Expiration Date: 202104202359
Blood Product Expiration Date: 202104202359
ISSUE DATE / TIME: 202103221652
ISSUE DATE / TIME: 202103222024
ISSUE DATE / TIME: 202103222315
Unit Type and Rh: 6200
Unit Type and Rh: 6200
Unit Type and Rh: 6200

## 2019-05-16 LAB — PROTEIN / CREATININE RATIO, URINE
Creatinine, Urine: 274.67 mg/dL
Protein Creatinine Ratio: 0.21 mg/mg{Cre} — ABNORMAL HIGH (ref 0.00–0.15)
Total Protein, Urine: 57 mg/dL

## 2019-05-16 NOTE — Progress Notes (Signed)
Patient discussed with RN after blood transfusion was completed that she would like to leave AMA, asking if she had to stay here. RN discussed with patient her rights, and educated her the risks of not receiving the necessary care and labs needed after receiving the blood transfusion. She stated she wants to leave so she can eat and just doesn't want to be here. Patient stated she would come back in a couple hours if needed. RN will notify charge nurse and MD.

## 2019-05-24 ENCOUNTER — Other Ambulatory Visit: Payer: Self-pay

## 2019-05-24 ENCOUNTER — Encounter: Payer: Medicaid Other | Admitting: Obstetrics & Gynecology

## 2019-05-24 MED ORDER — AMOXICILLIN 500 MG PO CAPS: 500.0000 mg | ORAL_CAPSULE | Freq: Three times a day (TID) | ORAL | 0 refills | Status: AC

## 2019-05-24 NOTE — Progress Notes (Signed)
Hi Dr. Debroah Loop,  This patient had a UTI on her NOB labs. Please see attached report. She has been difficult to reach and then was in the hospital but left AMA. In case she comes to her appointment today, I just wanted to make you aware so she could be treated.  Clinical pool - if she does not come for her visit today, please try to reach her. She needs to be treated with Amoxicillin for this particular bacteria, per infectious disease.  Thank you, Joni Reining

## 2019-05-24 NOTE — Progress Notes (Signed)
Amoxicillin 500mg  TID for 7 days prescribed for UTI per Dr. , pt made aware.

## 2019-05-25 ENCOUNTER — Ambulatory Visit (HOSPITAL_COMMUNITY)
Admission: RE | Admit: 2019-05-25 | Discharge: 2019-05-25 | Disposition: A | Discharge status: Home / Self Care | Payer: Medicaid Other | Source: Ambulatory Visit | Attending: Obstetrics and Gynecology | Admitting: Obstetrics and Gynecology

## 2019-05-25 ENCOUNTER — Other Ambulatory Visit: Payer: Self-pay

## 2019-05-25 ENCOUNTER — Encounter (HOSPITAL_COMMUNITY): Payer: Self-pay

## 2019-05-25 ENCOUNTER — Ambulatory Visit (HOSPITAL_COMMUNITY): Payer: Medicaid Other | Admitting: *Deleted

## 2019-05-25 ENCOUNTER — Other Ambulatory Visit (HOSPITAL_COMMUNITY): Payer: Self-pay | Admitting: *Deleted

## 2019-05-25 ENCOUNTER — Other Ambulatory Visit: Payer: Self-pay | Admitting: Women's Health

## 2019-05-25 DIAGNOSIS — O09219 Supervision of pregnancy with history of pre-term labor, unspecified trimester: Secondary | ICD-10-CM | POA: Diagnosis not present

## 2019-05-25 DIAGNOSIS — R87610 Atypical squamous cells of undetermined significance on cytologic smear of cervix (ASC-US): Secondary | ICD-10-CM | POA: Insufficient documentation

## 2019-05-25 DIAGNOSIS — O99332 Smoking (tobacco) complicating pregnancy, second trimester: Secondary | ICD-10-CM | POA: Insufficient documentation

## 2019-05-25 DIAGNOSIS — F1290 Cannabis use, unspecified, uncomplicated: Secondary | ICD-10-CM

## 2019-05-25 DIAGNOSIS — R011 Cardiac murmur, unspecified: Secondary | ICD-10-CM | POA: Diagnosis not present

## 2019-05-25 DIAGNOSIS — Z283 Underimmunization status: Secondary | ICD-10-CM

## 2019-05-25 DIAGNOSIS — R87810 Cervical high risk human papillomavirus (HPV) DNA test positive: Secondary | ICD-10-CM | POA: Diagnosis not present

## 2019-05-25 DIAGNOSIS — O0930 Supervision of pregnancy with insufficient antenatal care, unspecified trimester: Secondary | ICD-10-CM | POA: Diagnosis not present

## 2019-05-25 DIAGNOSIS — O365932 Maternal care for other known or suspected poor fetal growth, third trimester, fetus 2: Secondary | ICD-10-CM | POA: Diagnosis not present

## 2019-05-25 DIAGNOSIS — Z3A29 29 weeks gestation of pregnancy: Secondary | ICD-10-CM | POA: Diagnosis not present

## 2019-05-25 DIAGNOSIS — O0990 Supervision of high risk pregnancy, unspecified, unspecified trimester: Secondary | ICD-10-CM

## 2019-05-25 DIAGNOSIS — Z8759 Personal history of other complications of pregnancy, childbirth and the puerperium: Secondary | ICD-10-CM | POA: Insufficient documentation

## 2019-05-25 DIAGNOSIS — O99012 Anemia complicating pregnancy, second trimester: Secondary | ICD-10-CM | POA: Insufficient documentation

## 2019-05-25 DIAGNOSIS — O09899 Supervision of other high risk pregnancies, unspecified trimester: Secondary | ICD-10-CM

## 2019-05-25 DIAGNOSIS — O99891 Other specified diseases and conditions complicating pregnancy: Secondary | ICD-10-CM | POA: Diagnosis not present

## 2019-05-25 DIAGNOSIS — O30042 Twin pregnancy, dichorionic/diamniotic, second trimester: Secondary | ICD-10-CM

## 2019-05-25 DIAGNOSIS — O30002 Twin pregnancy, unspecified number of placenta and unspecified number of amniotic sacs, second trimester: Secondary | ICD-10-CM | POA: Insufficient documentation

## 2019-05-26 ENCOUNTER — Encounter: Payer: Self-pay | Admitting: General Practice

## 2019-05-29 ENCOUNTER — Encounter: Payer: Self-pay | Admitting: Women's Health

## 2019-05-30 ENCOUNTER — Encounter: Payer: Self-pay | Admitting: Women's Health

## 2019-05-30 DIAGNOSIS — O365990 Maternal care for other known or suspected poor fetal growth, unspecified trimester, not applicable or unspecified: Secondary | ICD-10-CM | POA: Insufficient documentation

## 2019-06-08 ENCOUNTER — Ambulatory Visit (HOSPITAL_COMMUNITY): Payer: Medicaid Other | Attending: Obstetrics and Gynecology

## 2019-06-08 ENCOUNTER — Encounter (HOSPITAL_COMMUNITY): Payer: Self-pay

## 2019-06-08 ENCOUNTER — Ambulatory Visit (HOSPITAL_COMMUNITY): Admission: RE | Admit: 2019-06-08 | Payer: Medicaid Other | Source: Ambulatory Visit

## 2019-06-20 ENCOUNTER — Inpatient Hospital Stay (HOSPITAL_BASED_OUTPATIENT_CLINIC_OR_DEPARTMENT_OTHER): Payer: Medicaid Other

## 2019-06-20 ENCOUNTER — Other Ambulatory Visit: Payer: Self-pay

## 2019-06-20 ENCOUNTER — Inpatient Hospital Stay (HOSPITAL_COMMUNITY): Payer: Medicaid Other | Admitting: Anesthesiology

## 2019-06-20 ENCOUNTER — Inpatient Hospital Stay (HOSPITAL_COMMUNITY)
Admission: AD | Admit: 2019-06-20 | Discharge: 2019-06-23 | DRG: 806 | Disposition: A | Discharge status: Home / Self Care | Payer: Medicaid Other | Attending: Obstetrics & Gynecology | Admitting: Obstetrics & Gynecology

## 2019-06-20 ENCOUNTER — Encounter (HOSPITAL_COMMUNITY): Payer: Self-pay | Admitting: Obstetrics & Gynecology

## 2019-06-20 DIAGNOSIS — Z20822 Contact with and (suspected) exposure to covid-19: Secondary | ICD-10-CM | POA: Diagnosis present

## 2019-06-20 DIAGNOSIS — O321XX2 Maternal care for breech presentation, fetus 2: Secondary | ICD-10-CM | POA: Diagnosis not present

## 2019-06-20 DIAGNOSIS — O99334 Smoking (tobacco) complicating childbirth: Secondary | ICD-10-CM | POA: Diagnosis not present

## 2019-06-20 DIAGNOSIS — Z3490 Encounter for supervision of normal pregnancy, unspecified, unspecified trimester: Secondary | ICD-10-CM

## 2019-06-20 DIAGNOSIS — F17210 Nicotine dependence, cigarettes, uncomplicated: Secondary | ICD-10-CM | POA: Diagnosis not present

## 2019-06-20 DIAGNOSIS — O365932 Maternal care for other known or suspected poor fetal growth, third trimester, fetus 2: Secondary | ICD-10-CM | POA: Diagnosis present

## 2019-06-20 DIAGNOSIS — O99013 Anemia complicating pregnancy, third trimester: Secondary | ICD-10-CM | POA: Diagnosis not present

## 2019-06-20 DIAGNOSIS — O4693 Antepartum hemorrhage, unspecified, third trimester: Secondary | ICD-10-CM

## 2019-06-20 DIAGNOSIS — O99324 Drug use complicating childbirth: Secondary | ICD-10-CM | POA: Diagnosis not present

## 2019-06-20 DIAGNOSIS — F1290 Cannabis use, unspecified, uncomplicated: Secondary | ICD-10-CM | POA: Diagnosis present

## 2019-06-20 DIAGNOSIS — O9902 Anemia complicating childbirth: Secondary | ICD-10-CM | POA: Diagnosis present

## 2019-06-20 DIAGNOSIS — R011 Cardiac murmur, unspecified: Secondary | ICD-10-CM | POA: Diagnosis present

## 2019-06-20 DIAGNOSIS — D62 Acute posthemorrhagic anemia: Secondary | ICD-10-CM | POA: Diagnosis present

## 2019-06-20 DIAGNOSIS — O0990 Supervision of high risk pregnancy, unspecified, unspecified trimester: Secondary | ICD-10-CM

## 2019-06-20 DIAGNOSIS — Z2839 Other underimmunization status: Secondary | ICD-10-CM

## 2019-06-20 DIAGNOSIS — O365990 Maternal care for other known or suspected poor fetal growth, unspecified trimester, not applicable or unspecified: Secondary | ICD-10-CM | POA: Diagnosis present

## 2019-06-20 DIAGNOSIS — O30043 Twin pregnancy, dichorionic/diamniotic, third trimester: Secondary | ICD-10-CM | POA: Diagnosis not present

## 2019-06-20 DIAGNOSIS — O1413 Severe pre-eclampsia, third trimester: Secondary | ICD-10-CM

## 2019-06-20 DIAGNOSIS — O30009 Twin pregnancy, unspecified number of placenta and unspecified number of amniotic sacs, unspecified trimester: Secondary | ICD-10-CM | POA: Diagnosis present

## 2019-06-20 DIAGNOSIS — O0930 Supervision of pregnancy with insufficient antenatal care, unspecified trimester: Secondary | ICD-10-CM

## 2019-06-20 DIAGNOSIS — Z3689 Encounter for other specified antenatal screening: Secondary | ICD-10-CM | POA: Diagnosis not present

## 2019-06-20 DIAGNOSIS — O169 Unspecified maternal hypertension, unspecified trimester: Secondary | ICD-10-CM

## 2019-06-20 DIAGNOSIS — Z3A33 33 weeks gestation of pregnancy: Secondary | ICD-10-CM | POA: Diagnosis not present

## 2019-06-20 DIAGNOSIS — D649 Anemia, unspecified: Secondary | ICD-10-CM | POA: Diagnosis present

## 2019-06-20 DIAGNOSIS — O1092 Unspecified pre-existing hypertension complicating childbirth: Secondary | ICD-10-CM | POA: Diagnosis not present

## 2019-06-20 DIAGNOSIS — O1414 Severe pre-eclampsia complicating childbirth: Principal | ICD-10-CM | POA: Diagnosis present

## 2019-06-20 DIAGNOSIS — O9081 Anemia of the puerperium: Secondary | ICD-10-CM | POA: Diagnosis present

## 2019-06-20 DIAGNOSIS — Z283 Underimmunization status: Secondary | ICD-10-CM

## 2019-06-20 HISTORY — DX: Unspecified maternal hypertension, unspecified trimester: O16.9

## 2019-06-20 LAB — COMPREHENSIVE METABOLIC PANEL
ALT: 27 U/L (ref 0–44)
AST: 34 U/L (ref 15–41)
Albumin: 2.1 g/dL — ABNORMAL LOW (ref 3.5–5.0)
Alkaline Phosphatase: 221 U/L — ABNORMAL HIGH (ref 38–126)
Anion gap: 10 (ref 5–15)
BUN: 11 mg/dL (ref 6–20)
CO2: 22 mmol/L (ref 22–32)
Calcium: 8.2 mg/dL — ABNORMAL LOW (ref 8.9–10.3)
Chloride: 106 mmol/L (ref 98–111)
Creatinine, Ser: 1.16 mg/dL — ABNORMAL HIGH (ref 0.44–1.00)
GFR calc Af Amer: >60 mL/min (ref >60)
GFR calc non Af Amer: >60 mL/min (ref >60)
Glucose, Bld: 82 mg/dL (ref 70–99)
Potassium: 3.9 mmol/L (ref 3.5–5.1)
Sodium: 138 mmol/L (ref 135–145)
Total Bilirubin: 0.6 mg/dL (ref 0.3–1.2)
Total Protein: 5.6 g/dL — ABNORMAL LOW (ref 6.5–8.1)

## 2019-06-20 LAB — RESPIRATORY PANEL BY RT PCR (FLU A&B, COVID)
Influenza A by PCR: NEGATIVE
Influenza B by PCR: NEGATIVE
SARS Coronavirus 2 by RT PCR: NEGATIVE

## 2019-06-20 LAB — RAPID URINE DRUG SCREEN, HOSP PERFORMED
Amphetamines: POSITIVE — AB
Barbiturates: NOT DETECTED
Benzodiazepines: POSITIVE — AB
Cocaine: NOT DETECTED
Opiates: POSITIVE — AB
Tetrahydrocannabinol: POSITIVE — AB

## 2019-06-20 LAB — PREPARE RBC (CROSSMATCH)

## 2019-06-20 LAB — PROTEIN / CREATININE RATIO, URINE
Creatinine, Urine: 55.77 mg/dL
Protein Creatinine Ratio: 0.18 mg/mg{Cre} — ABNORMAL HIGH (ref 0.00–0.15)
Total Protein, Urine: 10 mg/dL

## 2019-06-20 LAB — CBC
HCT: 25.8 % — ABNORMAL LOW (ref 36.0–46.0)
Hemoglobin: 7.7 g/dL — ABNORMAL LOW (ref 12.0–15.0)
MCH: 23.2 pg — ABNORMAL LOW (ref 26.0–34.0)
MCHC: 29.8 g/dL — ABNORMAL LOW (ref 30.0–36.0)
MCV: 77.7 fL — ABNORMAL LOW (ref 80.0–100.0)
Platelets: 214 10*3/uL (ref 150–400)
RBC: 3.32 MIL/uL — ABNORMAL LOW (ref 3.87–5.11)
RDW: 18.8 % — ABNORMAL HIGH (ref 11.5–15.5)
WBC: 8.3 10*3/uL (ref 4.0–10.5)
nRBC: 1.3 % — ABNORMAL HIGH (ref 0.0–0.2)

## 2019-06-20 LAB — POCT FERN TEST: POCT Fern Test: NEGATIVE

## 2019-06-20 MED ORDER — LIDOCAINE HCL (PF) 1 % IJ SOLN: 30.0000 mL | INTRAMUSCULAR | Status: DC | PRN

## 2019-06-20 MED ORDER — LABETALOL HCL 5 MG/ML IV SOLN
INTRAVENOUS | Status: AC
  Filled 2019-06-20: qty 4

## 2019-06-20 MED ORDER — SOD CITRATE-CITRIC ACID 500-334 MG/5ML PO SOLN: 30.0000 mL | ORAL | Status: DC | PRN

## 2019-06-20 MED ORDER — DIPHENHYDRAMINE HCL 50 MG/ML IJ SOLN: 12.5000 mg | INTRAMUSCULAR | Status: DC | PRN

## 2019-06-20 MED ORDER — LACTATED RINGERS IV SOLN: INTRAVENOUS | Status: DC

## 2019-06-20 MED ORDER — FLEET ENEMA 7-19 GM/118ML RE ENEM: 1.0000 | ENEMA | RECTAL | Status: DC | PRN

## 2019-06-20 MED ORDER — SODIUM CHLORIDE 0.9% IV SOLUTION: Freq: Once | INTRAVENOUS | Status: DC

## 2019-06-20 MED ORDER — OXYCODONE-ACETAMINOPHEN 5-325 MG PO TABS: 2.0000 | ORAL_TABLET | ORAL | Status: DC | PRN

## 2019-06-20 MED ORDER — LABETALOL HCL 5 MG/ML IV SOLN
INTRAVENOUS | Status: AC
  Administered 2019-06-20: 18:00:00 20 mg
  Filled 2019-06-20: qty 4

## 2019-06-20 MED ORDER — PROPOFOL 10 MG/ML IV BOLUS
INTRAVENOUS | Status: AC
  Filled 2019-06-20: qty 20

## 2019-06-20 MED ORDER — OXYTOCIN BOLUS FROM INFUSION
500.0000 mL | Freq: Once | INTRAVENOUS | Status: AC
  Administered 2019-06-21: 500 mL via INTRAVENOUS

## 2019-06-20 MED ORDER — OXYTOCIN BOLUS FROM INFUSION: 500.0000 mL | Freq: Once | INTRAVENOUS | Status: DC

## 2019-06-20 MED ORDER — MAGNESIUM SULFATE 40 GM/1000ML IV SOLN: 2.0000 g/h | INTRAVENOUS | Status: DC

## 2019-06-20 MED ORDER — PHENYLEPHRINE 40 MCG/ML (10ML) SYRINGE FOR IV PUSH (FOR BLOOD PRESSURE SUPPORT)
80.0000 ug | PREFILLED_SYRINGE | INTRAVENOUS | Status: DC | PRN
  Filled 2019-06-20: qty 10

## 2019-06-20 MED ORDER — LACTATED RINGERS IV SOLN: 500.0000 mL | INTRAVENOUS | Status: DC | PRN

## 2019-06-20 MED ORDER — OXYTOCIN 40 UNITS IN NORMAL SALINE INFUSION - SIMPLE MED
2.5000 [IU]/h | INTRAVENOUS | Status: DC
  Filled 2019-06-20: qty 1000

## 2019-06-20 MED ORDER — ONDANSETRON HCL 4 MG/2ML IJ SOLN
INTRAMUSCULAR | Status: AC
  Filled 2019-06-20: qty 2

## 2019-06-20 MED ORDER — OXYTOCIN 40 UNITS IN NORMAL SALINE INFUSION - SIMPLE MED: 2.5000 [IU]/h | INTRAVENOUS | Status: DC

## 2019-06-20 MED ORDER — MAGNESIUM SULFATE 40 GM/1000ML IV SOLN
INTRAVENOUS | Status: AC
  Filled 2019-06-20: qty 1000

## 2019-06-20 MED ORDER — FLEET ENEMA 7-19 GM/118ML RE ENEM
1.0000 | ENEMA | RECTAL | Status: DC | PRN
Start: 2019-06-20 — End: 2019-06-20

## 2019-06-20 MED ORDER — ONDANSETRON HCL 4 MG/2ML IJ SOLN: 4.0000 mg | Freq: Four times a day (QID) | INTRAMUSCULAR | Status: DC | PRN

## 2019-06-20 MED ORDER — OXYCODONE-ACETAMINOPHEN 5-325 MG PO TABS
2.0000 | ORAL_TABLET | ORAL | Status: DC | PRN
Start: 2019-06-20 — End: 2019-06-20

## 2019-06-20 MED ORDER — SODIUM CHLORIDE (PF) 0.9 % IJ SOLN
INTRAMUSCULAR | Status: DC | PRN
  Administered 2019-06-20: 12 mL/h via EPIDURAL

## 2019-06-20 MED ORDER — OXYCODONE-ACETAMINOPHEN 5-325 MG PO TABS: 1.0000 | ORAL_TABLET | ORAL | Status: DC | PRN

## 2019-06-20 MED ORDER — EPHEDRINE 5 MG/ML INJ: 10.0000 mg | INTRAVENOUS | Status: DC | PRN

## 2019-06-20 MED ORDER — OXYTOCIN 10 UNIT/ML IJ SOLN
INTRAMUSCULAR | Status: AC
  Filled 2019-06-20: qty 4

## 2019-06-20 MED ORDER — TRANEXAMIC ACID-NACL 1000-0.7 MG/100ML-% IV SOLN
1000.0000 mg | Freq: Once | INTRAVENOUS | Status: AC
  Administered 2019-06-21: 02:00:00 1000 mg via INTRAVENOUS
  Filled 2019-06-20: qty 100

## 2019-06-20 MED ORDER — SODIUM CHLORIDE 0.9 % IV SOLN: 2.0000 g | Freq: Four times a day (QID) | INTRAVENOUS | Status: DC

## 2019-06-20 MED ORDER — ACETAMINOPHEN 325 MG PO TABS
650.0000 mg | ORAL_TABLET | ORAL | Status: DC | PRN
Start: 2019-06-20 — End: 2019-06-20

## 2019-06-20 MED ORDER — FENTANYL CITRATE (PF) 250 MCG/5ML IJ SOLN
INTRAMUSCULAR | Status: AC
  Filled 2019-06-20: qty 5

## 2019-06-20 MED ORDER — MAGNESIUM SULFATE BOLUS VIA INFUSION
4.0000 g | Freq: Once | INTRAVENOUS | Status: AC
  Administered 2019-06-20: 18:00:00 4 g via INTRAVENOUS
  Filled 2019-06-20: qty 1000

## 2019-06-20 MED ORDER — ACETAMINOPHEN 325 MG PO TABS
650.0000 mg | ORAL_TABLET | ORAL | Status: DC | PRN
  Administered 2019-06-21: 03:00:00 650 mg via ORAL
  Filled 2019-06-20: qty 2

## 2019-06-20 MED ORDER — SODIUM CHLORIDE 0.9 % IV SOLN
2.0000 g | Freq: Four times a day (QID) | INTRAVENOUS | Status: DC
  Administered 2019-06-20 – 2019-06-21 (×2): 2 g via INTRAVENOUS
  Filled 2019-06-20 (×2): qty 2000

## 2019-06-20 MED ORDER — FENTANYL-BUPIVACAINE-NACL 0.5-0.125-0.9 MG/250ML-% EP SOLN
12.0000 mL/h | EPIDURAL | Status: DC | PRN
  Filled 2019-06-20: qty 250

## 2019-06-20 MED ORDER — LACTATED RINGERS IV SOLN
500.0000 mL | Freq: Once | INTRAVENOUS | Status: AC
  Administered 2019-06-20: 19:00:00 500 mL via INTRAVENOUS

## 2019-06-20 MED ORDER — LIDOCAINE HCL (PF) 1 % IJ SOLN
INTRAMUSCULAR | Status: DC | PRN
  Administered 2019-06-20: 5 mL via EPIDURAL

## 2019-06-20 MED ORDER — PHENYLEPHRINE 40 MCG/ML (10ML) SYRINGE FOR IV PUSH (FOR BLOOD PRESSURE SUPPORT)
80.0000 ug | PREFILLED_SYRINGE | INTRAVENOUS | Status: DC | PRN
  Administered 2019-06-20: 19:00:00 80 ug via INTRAVENOUS

## 2019-06-20 MED ORDER — LACTATED RINGERS IV SOLN
500.0000 mL | INTRAVENOUS | Status: DC | PRN
Start: 2019-06-20 — End: 2019-06-20
  Administered 2019-06-20: 18:00:00 500 mL via INTRAVENOUS

## 2019-06-20 MED ORDER — MIDAZOLAM HCL 2 MG/2ML IJ SOLN
INTRAMUSCULAR | Status: AC
  Filled 2019-06-20: qty 2

## 2019-06-20 MED ORDER — BETAMETHASONE SOD PHOS & ACET 6 (3-3) MG/ML IJ SUSP
12.0000 mg | Freq: Once | INTRAMUSCULAR | Status: AC
  Administered 2019-06-20: 18:00:00 12 mg via INTRAMUSCULAR
  Filled 2019-06-20: qty 5

## 2019-06-20 NOTE — MAU Note (Signed)
Ctx's started around 0630, then they eased off.  Went to sleep.  Woke up at 1600 to being flooded by liquid bloody stuff (more bloody then anything), has started contracting again. Hx of PTD

## 2019-06-20 NOTE — Anesthesia Procedure Notes (Signed)

## 2019-06-20 NOTE — MAU Provider Note (Signed)
History     CSN: [id number]  Arrival date and time: 06/20/19 1631   None     Chief Complaint  Patient presents with  . Contractions  . Rupture of Membranes   HPI  Holly Bridges is a 29 y.o. female [phone] @ 49w4ddi/di twin gestation here with ?ROM which occurred around 1600 this afternoon. States she woke up to a large amount of bloody  Fluid all over her self and clothes. She had contractions this morning around 0630, however the contractions stopped. She feels fetal movement with both babies. Hx of preterm delivery x 2. Vaginal delivery is desired. Diagnosed with elevated BP in this pregnancy; denies HA or scotoma.   OB History    Gravida  4   Para  3   Term  2   Preterm  1   AB      Living  3     SAB      TAB      Ectopic      Multiple      Live Births  3           Past Medical History:  Diagnosis Date  . Anemia     History reviewed. No pertinent surgical history.  No family history on file.  Social History   Tobacco Use  . Smoking status: Current Every Day Smoker    Packs/day: 1.00    Years: 5.00    Pack years: 5.00    Types: Cigarettes  Substance Use Topics  . Alcohol use: No  . Drug use: Yes    Frequency: 1.0 times per week    Types: Marijuana    Comment: daily use    Allergies:  Allergies  Allergen Reactions  . Sulfa Antibiotics     Medications Prior to Admission  Medication Sig Dispense Refill Last Dose  . Acetaminophen (TYLENOL PO) Take by mouth.   06/20/2019 at Unknown time  . aspirin EC 81 MG tablet Take 1 tablet (81 mg total) by mouth daily. 30 tablet 5 06/19/2019 at Unknown time  . Prenatal Vit-Fe Fumarate-FA (PRENATAL VITAMIN PO) Take by mouth.   06/19/2019 at Unknown time  . Blood Pressure Monitor KIT Please check blood pressure 1-2 weeks. 1 kit 0   . naproxen sodium (ANAPROX) 220 MG tablet Take 220-440 mg by mouth 2 (two) times daily as needed. For pain.       Results for orders placed or performed during the  hospital encounter of 06/20/19 (from the past 48 hour(s))  CBC     Status: Abnormal   Collection Time: 06/20/19  5:24 PM  Result Value Ref Range   WBC 8.3 4.0 - 10.5 K/uL   RBC 3.32 (L) 3.87 - 5.11 MIL/uL   Hemoglobin 7.7 (L) 12.0 - 15.0 g/dL    Comment: Reticulocyte Hemoglobin testing may be clinically indicated, consider ordering this additional test [id number]   HCT 25.8 (L) 36.0 - 46.0 %   MCV 77.7 (L) 80.0 - 100.0 fL   MCH 23.2 (L) 26.0 - 34.0 pg   MCHC 29.8 (L) 30.0 - 36.0 g/dL   RDW 18.8 (H) 11.5 - 15.5 %   Platelets 214 150 - 400 K/uL   nRBC 1.3 (H) 0.0 - 0.2 %    Comment: Performed at [facility] [address], [place] [id number] Type and screen [email or web address]    Status: None (Preliminary result)   Collection Time: 06/20/19  5:40 PM  Result Value Ref Range   ABO/RH(D) PENDING    Antibody Screen PENDING    Sample Expiration      06/23/2019,2359 Performed at [facility], [place] [address] [place], [place] [address]    Review of Systems  Eyes: Negative for photophobia and visual disturbance.  Gastrointestinal: Positive for abdominal pain (Contraction pain.).  Genitourinary: Positive for vaginal bleeding.  Neurological: Negative for headaches.   Physical Exam   Blood pressure (!) 150/93, pulse 69, temperature 98.6 F (37 C), temperature source Axillary, resp. rate 20, height 5' 4"  (1.626 m), weight 78 kg, last menstrual period 10/25/2018, SpO2 97 %.  Patient Vitals for the past 24 hrs:  BP Temp Temp src Pulse Resp SpO2 Height Weight  06/20/19 1800 (!) 164/105 -- -- 74 15 -- -- --  06/20/19 1757 -- -- -- -- -- -- 5' 4"  (1.626 m) 78 kg  06/20/19 1720 -- -- -- -- -- 97 % -- --  06/20/19 1715 (!) 150/93 -- -- 69 -- 97 % -- --  06/20/19 1710 -- -- -- -- -- 98 % -- --  06/20/19 1706 (!) 161/91 -- -- 64 -- -- -- --  06/20/19 1649 (!) 158/110 98.6 F (37 C) Axillary 92 20 100 % 5' 4"  (1.626 m) 78 kg   Physical Exam  Constitutional:  She is oriented to person, place, and time. She appears well-nourished.  GI: Soft. She exhibits no distension. There is no abdominal tenderness. There is no rebound.  Genitourinary:    Genitourinary Comments: Vagina - Small amount of dark red blood oozing from vagina.  Cervix - membranes noted bulging through speculum. Moderate amount of bloody discharge noted in vault.  Bimanual exam: Deferred  Chaperone present for exam.    Musculoskeletal:        General: Normal range of motion.  Neurological: She is alert and oriented to person, place, and time.  Skin: Skin is warm.  Psychiatric: Her behavior is normal.   Fetal Tracing Fetus A Baseline: 135 bpm Variability: Moderate  Accelerations: 10x10 Decelerations: None Toco: regular contractions.   Fetal Tracing Fetus B Baseline: 130 bpm Variability: Moderate  Accelerations: 10x10 Decelerations: None  MAU Course  Procedures  None  MDM  Dr. Nehemiah Settle in the [place] however notified of patient's arrival, fetal tracing, and vaginal bleeding.  [place] labs collected, 1 severe range BP, repeat BP 150/93 IV established BMZ ordered Covid swab collected  [place] called to room to determine position of both fetus. Dr. Dione Plover at bedside at [place]:  1. Preeclampsia, severe, third trimester   2. Late prenatal care   3. Vaginal bleeding in pregnancy, third trimester   4. Preterm labor in third trimester with preterm delivery, fetus 2 of multiple gestation     P:  Admit to labor and delivery.  Dr. Nehemiah Settle and Dr. Dione Plover to resume care.   Noni Saupe I, NP 06/20/2019 6:08 PM

## 2019-06-20 NOTE — H&P (Signed)
Faculty Practice H&P  Holly Bridges is a 29 y.o. female [phone] with IUP at [redacted]w[redacted]d presenting for preterm labor with vaginal bleeding. Pregnancy was been complicated by di/di twins with FGR of baby B and discordant growth of approximately 25%, with history of PPROM and delivery at 34 weeks, anemia of pregnancy, Rubella non-immune status, .    Pt states she has been having contractions, vaginal bleeding. Membranes are intact, with normal fetal movement.     Prenatal Course Source of Care: [facility] with onset of care at 22-23weeks  Pregnancy complications or risks: Patient Active Problem List   Diagnosis Date Noted  . Hypertension affecting pregnancy 06/20/2019  . Pregnancy 06/20/2019  . Pregnancy affected by fetal growth restriction 05/30/2019  . Symptomatic anemia 05/15/2019  . ASCUS with positive high risk HPV cervical 05/12/2019  . Anemia in pregnancy, second trimester 05/11/2019  . Rubella non-immune status, antepartum 05/11/2019  . Heart murmur 05/11/2019  . Supervision of high risk pregnancy, antepartum 05/10/2019  . Tobacco smoking affecting pregnancy in second trimester 05/10/2019  . Uses marijuana 05/10/2019  . Twin pregnancy 05/10/2019  . Late prenatal care 05/10/2019  . History of retained placenta 05/10/2019  . History of preterm premature rupture of membranes (PPROM) 05/10/2019   She desires bilateral tubal ligation for contraception.  She plans to bottle feed  Prenatal labs and studies: ABO, Rh: --/--/PENDING (04/27 1740) Antibody: PENDING (04/27 1740) Rubella: <0.90 (03/17 1541) RPR: Non Reactive (03/17 1541)  HBsAg: Negative (03/17 1541)  HIV: Non Reactive (03/17 1541)  GBS:    2hr Glucola: not done Genetic screening: not done Anatomy US: abnormal with FGR  Past Medical History:  Past Medical History:  Diagnosis Date  . Anemia   . Hypertension affecting pregnancy 06/20/2019    Past Surgical History:  Past Surgical History:  Procedure Laterality  Date  . adnoids    . DILATION AND CURETTAGE OF UTERUS    . TONSILLECTOMY      Obstetrical History:  OB History    Gravida  4   Para  3   Term  2   Preterm  1   AB      Living  3     SAB      TAB      Ectopic      Multiple      Live Births  3           Gynecological History:  OB History    Gravida  4   Para  3   Term  2   Preterm  1   AB      Living  3     SAB      TAB      Ectopic      Multiple      Live Births  3           Social History:  Social History   Socioeconomic History  . Marital status: Single    Spouse name: Not on file  . Number of children: Not on file  . Years of education: Not on file  . Highest education level: Not on file  Occupational History  . Not on file  Tobacco Use  . Smoking status: Current Every Day Smoker    Packs/day: 1.00    Years: 5.00    Pack years: 5.00    Types: Cigarettes  Substance and Sexual Activity  . Alcohol use: No  . Drug use: Yes  Frequency: 1.0 times per week    Types: Marijuana    Comment: daily use  . Sexual activity: Yes  Other Topics Concern  . Not on file  Social History Narrative  . Not on file   Social Determinants of Health   Financial Resource Strain:   . Difficulty of Paying Living Expenses:   Food Insecurity:   . Worried About Charity fundraiser in the Last Year:   . Arboriculturist in the Last Year:   Transportation Needs:   . Film/video editor (Medical):   Marland Kitchen Lack of Transportation (Non-Medical):   Physical Activity:   . Days of Exercise per Week:   . Minutes of Exercise per Session:   Stress:   . Feeling of Stress :   Social Connections:   . Frequency of Communication with Friends and Family:   . Frequency of Social Gatherings with Friends and Family:   . Attends Religious Services:   . Active Member of Clubs or Organizations:   . Attends Archivist Meetings:   Marland Kitchen Marital Status:     Family History: No family history on  file.  Medications:  Prenatal vitamins,  Current Facility-Administered Medications  Medication Dose Route Frequency Provider Last Rate Last Admin  . acetaminophen (TYLENOL) tablet 650 mg  650 mg Oral Q4H PRN Truett Mainland, DO      . diphenhydrAMINE (BENADRYL) injection 12.5 mg  12.5 mg Intravenous Q15 min PRN Truett Mainland, DO      . ePHEDrine injection 10 mg  10 mg Intravenous PRN Truett Mainland, DO      . ePHEDrine injection 10 mg  10 mg Intravenous PRN Loma Boston J, DO      . fentaNYL 2 mcg/mL w/ bupivacaine 0.125% in NS 250 mL epidural infusion ([facility])  12 mL/hr Epidural Continuous PRN Truett Mainland, DO      . labetalol (NORMODYNE) 5 MG/ML injection           . lactated ringers infusion 500 mL  500 mL Intravenous Once Delvis Kau J, DO      . lactated ringers infusion 500-1,000 mL  500-1,000 mL Intravenous PRN Truett Mainland, DO      . lactated ringers infusion   Intravenous Continuous Rasch, Anderson Malta I, NP      . lactated ringers infusion   Intravenous Continuous Truett Mainland, DO      . lidocaine (PF) (XYLOCAINE) 1 % injection 30 mL  30 mL Subcutaneous PRN Truett Mainland, DO      . magnesium bolus via infusion 4 g  4 g Intravenous Once Truett Mainland, DO   4 g at 06/20/19 1808  . magnesium sulfate 40 grams in SWI 1000 mL 40 GM/1000ML infusion           . magnesium sulfate 40 grams in SWI 1000 mL OB infusion  2 g/hr Intravenous Titrated Jolynda Townley J, DO      . ondansetron [facility]) injection 4 mg  4 mg Intravenous Q6H PRN Truett Mainland, DO      . oxyCODONE-acetaminophen (PERCOCET/ROXICET) 5-325 MG per tablet 1 tablet  1 tablet Oral Q4H PRN Truett Mainland, DO      . oxyCODONE-acetaminophen (PERCOCET/ROXICET) 5-325 MG per tablet 2 tablet  2 tablet Oral Q4H PRN Truett Mainland, DO      . oxytocin (PITOCIN) IV BOLUS FROM BAG  500 mL Intravenous Once Truett Mainland, DO      .  oxytocin (PITOCIN) IV infusion 40 units in NS 1000 mL - Premix  2.5 Units/hr  Intravenous Continuous Levie Heritage, DO      . PHENYLephrine 40 mcg/ml in normal saline Adult IV Push Syringe (For Blood Pressure Support)  80 mcg Intravenous PRN Levie Heritage, DO      . PHENYLephrine 40 mcg/ml in normal saline Adult IV Push Syringe (For Blood Pressure Support)  80 mcg Intravenous PRN Levie Heritage, DO      . sodium citrate-citric acid (ORACIT) solution 30 mL  30 mL Oral Q2H PRN Levie Heritage, DO      . sodium phosphate (FLEET) 7-19 GM/118ML enema 1 enema  1 enema Rectal PRN Levie Heritage, DO        Allergies:  Allergies  Allergen Reactions  . Sulfa Antibiotics     Review of Systems: - negative  Physical Exam: Blood pressure (!) 164/105, pulse 74, temperature 98.2 F (36.8 C), temperature source Oral, resp. rate 15, height 5\' 4"  (1.626 m), weight 78 kg, last menstrual period 10/25/2018, SpO2 100 %. GENERAL: Well-developed, well-nourished female in no acute distress.  LUNGS: Clear to auscultation bilaterally.  HEART: Regular rate and rhythm. ABDOMEN: Soft, nontender, nondistended, gravid. EFW 3 lbs EXTREMITIES: Nontender, no edema, 2+ distal pulses. Cervical Exam: Dilatation 7.5 cm   Effacement 100%   Station +1   Presentation: vtx/breech FHT:  Baseline rate 130s bpm   Variability moderate  Accelerations absent   Decelerations none Contractions: Every 3-5 mins   Pertinent Labs/Studies:   Lab Results  Component Value Date   WBC 8.3 06/20/2019   HGB 7.7 (L) 06/20/2019   HCT 25.8 (L) 06/20/2019   MCV 77.7 (L) 06/20/2019   PLT 214 06/20/2019    Assessment : Holly Bridges is a 29 y.o. 26 at [redacted]w[redacted]d being admitted for preterm labor with possible abruption. Preeclampsia  Plan: Magnesium for preeclampsia BMZ x1 dose given Ampicllin  [redacted]w[redacted]d, DO 06/20/2019, 6:19 PM

## 2019-06-20 NOTE — MAU Note (Signed)
Prior pre-e dx.  Feet and ankles swollen,  Denies hx, visual changes or epigastric pain.

## 2019-06-20 NOTE — Anesthesia Preprocedure Evaluation (Signed)
Anesthesia Evaluation  Patient identified by MRN, date of birth, ID band Patient awake    Reviewed: Allergy & Precautions, NPO status , Patient's Chart, lab work & pertinent test results  Airway Mallampati: II  TM Distance: >3 FB Neck ROM: Full    Dental no notable dental hx. (+) Teeth Intact   Pulmonary Current Smoker,    Pulmonary exam normal breath sounds clear to auscultation       Cardiovascular hypertension, Normal cardiovascular exam Rhythm:Regular Rate:Normal     Neuro/Psych negative neurological ROS  negative psych ROS   GI/Hepatic negative GI ROS, Neg liver ROS,   Endo/Other  negative endocrine ROS  Renal/GU Cr 1.16     Musculoskeletal   Abdominal   Peds  Hematology  (+) anemia , Lab Results      Component                Value               Date                      WBC                      8.3                 06/20/2019                HGB                      7.7 (L)             06/20/2019                HCT                      25.8 (L)            06/20/2019                MCV                      77.7 (L)            06/20/2019                PLT                      214                 06/20/2019              Anesthesia Other Findings   Reproductive/Obstetrics (+) Pregnancy                            Anesthesia Physical Anesthesia Plan  ASA: III  Anesthesia Plan: Epidural   Post-op Pain Management:    Induction:   PONV Risk Score and Plan:   Airway Management Planned:   Additional Equipment:   Intra-op Plan:   Post-operative Plan:   Informed Consent: I have reviewed the patients History and Physical, chart, labs and discussed the procedure including the risks, benefits and alternatives for the proposed anesthesia with the patient or authorized representative who has indicated his/her understanding and acceptance.       Plan Discussed with:   Anesthesia  Plan Comments: (33.4 wk G4P3 Twins with hx of smokiing, PIH, )  Anesthesia Quick Evaluation  

## 2019-06-20 NOTE — Progress Notes (Signed)
Patient ID: Holly Bridges, female   DOB: [birth year], 29 y.o.   MRN: [id number]  Patient anemic: Hg 7.7. I discussed blood transfusion with patient. She said she had one earlier in pregnancy and it made her feel lousy. I advised her that she could lose a lot of blood quickly ( per minute) and that I feel like she would benefit from a blood transfusion prior to delivery if possible. She said that she would like to wait until after delivery.   Will place 2 units PRBC and 1 unit of FFP on hold.  Give TXA during delivery.  Will be ready for PPH.  Levie Heritage, DO 06/20/2019 6:38 PM

## 2019-06-20 NOTE — Progress Notes (Signed)
Labor Progress Note Holly Bridges is a 29 y.o. [phone] at 68w4dpresented for preterm labor with possible abruption. Also with severe Pre-E. S: Comfortable with epidural. Met patient and discussed plan.   O:  BP (!) 151/106   Pulse 88   Temp (!) 97.5 F (36.4 C) (Axillary)   Resp 15   Ht 5' 4"  (1.626 m)   Wt 78 kg   LMP 10/25/2018 (LMP Unknown)   SpO2 100%   BMI 29.52 kg/m  EFM:  A: 125, moderate variability, 10x10 accels, no decels, reactive B: 125, moderate variability, 10x10 accels, no decels, reactive TOCO: q5-675mCVE: Dilation: 7.5 Station: -1 Presentation: Vertex Exam by:: ChBarrington Ellison A&P: [age].o. [id number] for preterm labor with possible abruption. Also with severe Pre-E. #Labor: Vertex/breech. Expectant management as awaiting adequate GBS ppx and more time with BMZ. AROM PRN for fetal/maternal indication. Anticipate SVDx2. Discussed breech extraction and patient agreeable. Cont Mag. #Pain: epidural #FWB: Cat I x2  #GBS pending; Amp started at [name] Pre-E: [place] to severe range pressures and has received Labetalol. Pr/Cr 0.18 and Cr 1.16. Plt 214. #UDS positive for opiates, benzos, amphetamines and THC; consult SW PP #Anemia: See previous note by Dr. StiNehemiah SettleXA ordered to be given prior to delivery. Hgb 7.7 on admission. 2 u PRBC and 1 u FFB on hold.   CheChauncey MannD 9:27 PM

## 2019-06-21 ENCOUNTER — Encounter (HOSPITAL_COMMUNITY): Payer: Self-pay | Admitting: Family Medicine

## 2019-06-21 DIAGNOSIS — Z3A33 33 weeks gestation of pregnancy: Secondary | ICD-10-CM

## 2019-06-21 DIAGNOSIS — O99013 Anemia complicating pregnancy, third trimester: Secondary | ICD-10-CM

## 2019-06-21 DIAGNOSIS — O30043 Twin pregnancy, dichorionic/diamniotic, third trimester: Secondary | ICD-10-CM

## 2019-06-21 DIAGNOSIS — O321XX2 Maternal care for breech presentation, fetus 2: Secondary | ICD-10-CM | POA: Diagnosis not present

## 2019-06-21 DIAGNOSIS — D649 Anemia, unspecified: Secondary | ICD-10-CM

## 2019-06-21 DIAGNOSIS — O365932 Maternal care for other known or suspected poor fetal growth, third trimester, fetus 2: Secondary | ICD-10-CM

## 2019-06-21 LAB — PREPARE FRESH FROZEN PLASMA: Unit division: 0

## 2019-06-21 LAB — COMPREHENSIVE METABOLIC PANEL
ALT: 33 U/L (ref 0–44)
AST: 37 U/L (ref 15–41)
Albumin: 1.8 g/dL — ABNORMAL LOW (ref 3.5–5.0)
Alkaline Phosphatase: 193 U/L — ABNORMAL HIGH (ref 38–126)
Anion gap: 8 (ref 5–15)
BUN: 9 mg/dL (ref 6–20)
CO2: 20 mmol/L — ABNORMAL LOW (ref 22–32)
Calcium: 7 mg/dL — ABNORMAL LOW (ref 8.9–10.3)
Chloride: 103 mmol/L (ref 98–111)
Creatinine, Ser: 1.12 mg/dL — ABNORMAL HIGH (ref 0.44–1.00)
GFR calc Af Amer: >60 mL/min (ref >60)
GFR calc non Af Amer: >60 mL/min (ref >60)
Glucose, Bld: 126 mg/dL — ABNORMAL HIGH (ref 70–99)
Potassium: 4.5 mmol/L (ref 3.5–5.1)
Sodium: 131 mmol/L — ABNORMAL LOW (ref 135–145)
Total Bilirubin: 0.6 mg/dL (ref 0.3–1.2)
Total Protein: 5.1 g/dL — ABNORMAL LOW (ref 6.5–8.1)

## 2019-06-21 LAB — CBC
HCT: 21.1 % — ABNORMAL LOW (ref 36.0–46.0)
Hemoglobin: 6.4 g/dL — CL (ref 12.0–15.0)
MCH: 23.2 pg — ABNORMAL LOW (ref 26.0–34.0)
MCHC: 30.3 g/dL (ref 30.0–36.0)
MCV: 76.4 fL — ABNORMAL LOW (ref 80.0–100.0)
Platelets: 197 10*3/uL (ref 150–400)
RBC: 2.76 MIL/uL — ABNORMAL LOW (ref 3.87–5.11)
RDW: 18.6 % — ABNORMAL HIGH (ref 11.5–15.5)
WBC: 19.9 10*3/uL — ABNORMAL HIGH (ref 4.0–10.5)
nRBC: 0.5 % — ABNORMAL HIGH (ref 0.0–0.2)

## 2019-06-21 LAB — BPAM FFP
Blood Product Expiration Date: 202104302359
ISSUE DATE / TIME: 202104271844
Unit Type and Rh: 600

## 2019-06-21 LAB — MAGNESIUM: Magnesium: 6.3 mg/dL (ref 1.7–2.4)

## 2019-06-21 LAB — RPR: RPR Ser Ql: NONREACTIVE

## 2019-06-21 MED ORDER — ONDANSETRON HCL 4 MG PO TABS: 4.0000 mg | ORAL_TABLET | ORAL | Status: DC | PRN

## 2019-06-21 MED ORDER — PRENATAL MULTIVITAMIN CH
1.0000 | ORAL_TABLET | Freq: Every day | ORAL | Status: DC
  Administered 2019-06-21 – 2019-06-23 (×3): 1 via ORAL
  Filled 2019-06-21 (×3): qty 1

## 2019-06-21 MED ORDER — ONDANSETRON HCL 4 MG/2ML IJ SOLN: 4.0000 mg | INTRAMUSCULAR | Status: DC | PRN

## 2019-06-21 MED ORDER — MEASLES, MUMPS & RUBELLA VAC IJ SOLR
0.5000 mL | Freq: Once | INTRAMUSCULAR | Status: DC
  Filled 2019-06-21 (×2): qty 0.5

## 2019-06-21 MED ORDER — OXYCODONE HCL 5 MG PO TABS
5.0000 mg | ORAL_TABLET | ORAL | Status: DC | PRN
  Administered 2019-06-21 – 2019-06-23 (×5): 5 mg via ORAL
  Filled 2019-06-21 (×5): qty 1

## 2019-06-21 MED ORDER — SODIUM CHLORIDE 0.9 % IV SOLN: 510.0000 mg | Freq: Once | INTRAVENOUS | Status: DC

## 2019-06-21 MED ORDER — LABETALOL HCL 5 MG/ML IV SOLN: 40.0000 mg | INTRAVENOUS | Status: DC | PRN

## 2019-06-21 MED ORDER — DIBUCAINE (PERIANAL) 1 % EX OINT: 1.0000 "application " | TOPICAL_OINTMENT | CUTANEOUS | Status: DC | PRN

## 2019-06-21 MED ORDER — LABETALOL HCL 5 MG/ML IV SOLN: 80.0000 mg | INTRAVENOUS | Status: DC | PRN

## 2019-06-21 MED ORDER — FERROUS SULFATE 325 (65 FE) MG PO TABS
325.0000 mg | ORAL_TABLET | Freq: Every day | ORAL | Status: DC
  Administered 2019-06-21 – 2019-06-23 (×3): 325 mg via ORAL
  Filled 2019-06-21 (×3): qty 1

## 2019-06-21 MED ORDER — COCONUT OIL OIL: 1.0000 "application " | TOPICAL_OIL | Status: DC | PRN

## 2019-06-21 MED ORDER — WITCH HAZEL-GLYCERIN EX PADS: 1.0000 "application " | MEDICATED_PAD | CUTANEOUS | Status: DC | PRN

## 2019-06-21 MED ORDER — DIPHENHYDRAMINE HCL 25 MG PO CAPS: 25.0000 mg | ORAL_CAPSULE | Freq: Four times a day (QID) | ORAL | Status: DC | PRN

## 2019-06-21 MED ORDER — MAGNESIUM SULFATE 40 GM/1000ML IV SOLN
2.0000 g/h | INTRAVENOUS | Status: AC
  Administered 2019-06-21: 10:00:00 2 g/h via INTRAVENOUS
  Filled 2019-06-21: qty 1000

## 2019-06-21 MED ORDER — BENZOCAINE-MENTHOL 20-0.5 % EX AERO
1.0000 "application " | INHALATION_SPRAY | CUTANEOUS | Status: DC | PRN
  Administered 2019-06-23: 1 via TOPICAL
  Filled 2019-06-21: qty 56

## 2019-06-21 MED ORDER — HYDRALAZINE HCL 10 MG PO TABS: 10.0000 mg | ORAL_TABLET | Freq: Four times a day (QID) | ORAL | Status: DC | PRN

## 2019-06-21 MED ORDER — TERBUTALINE SULFATE 1 MG/ML IJ SOLN: 0.2500 mg | Freq: Once | INTRAMUSCULAR | Status: DC | PRN

## 2019-06-21 MED ORDER — LACTATED RINGERS IV SOLN: INTRAVENOUS | Status: DC

## 2019-06-21 MED ORDER — OXYTOCIN 40 UNITS IN NORMAL SALINE INFUSION - SIMPLE MED
1.0000 m[IU]/min | INTRAVENOUS | Status: DC
  Administered 2019-06-21: 02:00:00 2 m[IU]/min via INTRAVENOUS

## 2019-06-21 MED ORDER — ACETAMINOPHEN 325 MG PO TABS: 650.0000 mg | ORAL_TABLET | Freq: Four times a day (QID) | ORAL | Status: DC | PRN

## 2019-06-21 MED ORDER — AMLODIPINE BESYLATE 10 MG PO TABS
10.0000 mg | ORAL_TABLET | Freq: Every day | ORAL | Status: DC
  Administered 2019-06-21 – 2019-06-23 (×3): 10 mg via ORAL
  Filled 2019-06-21 (×3): qty 1

## 2019-06-21 MED ORDER — SENNOSIDES-DOCUSATE SODIUM 8.6-50 MG PO TABS
2.0000 | ORAL_TABLET | ORAL | Status: DC
  Administered 2019-06-21 – 2019-06-23 (×2): 2 via ORAL
  Filled 2019-06-21 (×2): qty 2

## 2019-06-21 MED ORDER — TRANEXAMIC ACID-NACL 1000-0.7 MG/100ML-% IV SOLN
INTRAVENOUS | Status: AC
  Administered 2019-06-21: 04:00:00 1000 mg via INTRAVENOUS
  Filled 2019-06-21: qty 100

## 2019-06-21 MED ORDER — MISOPROSTOL 200 MCG PO TABS
ORAL_TABLET | ORAL | Status: AC
  Administered 2019-06-21: 04:00:00 1000 ug
  Filled 2019-06-21: qty 5

## 2019-06-21 MED ORDER — LABETALOL HCL 5 MG/ML IV SOLN
INTRAVENOUS | Status: AC
  Administered 2019-06-21: 05:00:00 20 mg
  Filled 2019-06-21: qty 4

## 2019-06-21 MED ORDER — SODIUM CHLORIDE 0.9 % IV SOLN
510.0000 mg | Freq: Once | INTRAVENOUS | Status: DC
  Filled 2019-06-21: qty 17

## 2019-06-21 MED ORDER — ACETAMINOPHEN 500 MG PO TABS
1000.0000 mg | ORAL_TABLET | Freq: Four times a day (QID) | ORAL | Status: DC
  Administered 2019-06-21 – 2019-06-23 (×8): 1000 mg via ORAL
  Filled 2019-06-21 (×9): qty 2

## 2019-06-21 MED ORDER — SODIUM CHLORIDE 0.9 % IV SOLN: 2.0000 g | Freq: Four times a day (QID) | INTRAVENOUS | Status: DC

## 2019-06-21 MED ORDER — IBUPROFEN 600 MG PO TABS
600.0000 mg | ORAL_TABLET | Freq: Three times a day (TID) | ORAL | Status: DC | PRN
  Administered 2019-06-21 – 2019-06-23 (×5): 600 mg via ORAL
  Filled 2019-06-21 (×6): qty 1

## 2019-06-21 MED ORDER — SIMETHICONE 80 MG PO CHEW: 80.0000 mg | CHEWABLE_TABLET | ORAL | Status: DC | PRN

## 2019-06-21 MED ORDER — LABETALOL HCL 5 MG/ML IV SOLN
20.0000 mg | INTRAVENOUS | Status: DC | PRN
  Administered 2019-06-22: 17:00:00 20 mg via INTRAVENOUS
  Filled 2019-06-21: qty 4

## 2019-06-21 MED ORDER — SODIUM CHLORIDE 0.9 % IV SOLN
510.0000 mg | Freq: Once | INTRAVENOUS | Status: AC
  Administered 2019-06-22: 04:00:00 510 mg via INTRAVENOUS
  Filled 2019-06-21: qty 17

## 2019-06-21 MED ORDER — TETANUS-DIPHTH-ACELL PERTUSSIS 5-2.5-18.5 LF-MCG/0.5 IM SUSP
0.5000 mL | Freq: Once | INTRAMUSCULAR | Status: DC
  Filled 2019-06-21: qty 0.5

## 2019-06-21 NOTE — Anesthesia Postprocedure Evaluation (Signed)
Anesthesia Post Note  Patient: ERIK NESSEL  Procedure(s) Performed: AN AD HOC LABOR EPIDURAL     Patient location during evaluation: Mother Baby Anesthesia Type: Epidural Level of consciousness: awake and alert Pain management: pain level controlled Vital Signs Assessment: post-procedure vital signs reviewed and stable Respiratory status: spontaneous breathing, nonlabored ventilation and respiratory function stable Cardiovascular status: stable Postop Assessment: no headache, no backache and epidural receding Anesthetic complications: no    Last Vitals:  Vitals:   06/21/19 0619 06/21/19 0818  BP: 123/82 (!) 155/95  Pulse: 70 68  Resp: 20 20  Temp: (!) 36.3 C 36.8 C  SpO2: 100% 98%    Last Pain:  Vitals:   06/21/19 0818  TempSrc: Axillary  PainSc:    Pain Goal:                   Nickayla Mcinnis

## 2019-06-21 NOTE — Lactation Note (Signed)
This note was copied from a baby's chart. Lactation Consultation Note  Patient Name: Boy A Annalisse Minkoff Today's Date: 06/21/2019 Reason for consult: Initial assessment;NICU baby;Multiple gestation;Infant < 6lbs  1330 - Lactation entered the room to conduct an initial consult. Ms. Sliwinski appeared sleepy upon entry, and I apologized for waking her. I asked if I could assist her with setting up and DEBP and she indicated that she was formula feeding. No DEBP in the room.  I followed up post visit with her assigned RN who also corroborated this decision. RN stated that she may ask again tomorrow when Ms. Dewey is more alert. I recommended counseling Ms. Schaner on benefits of breast milk to babies who are preterm and in the NICU. At this time, Ms. Harbin did not appear well enough to engage in conversation.   Call lactation for follow up PRN.  Consult Status Consult Status: Complete    Walker Shadow 06/21/2019, 2:36 PM

## 2019-06-21 NOTE — Progress Notes (Signed)
CRITICAL VALUE ALERT  Critical Value:  Mg 6.3  Date & Time Notied:  06/21/2019  Provider Notified: WNL; Dr. Macon Large to see lab result.  Orders Received/Actions taken: none; pt is therapeutic.

## 2019-06-21 NOTE — Discharge Summary (Signed)
Postpartum Discharge Summary    Patient Name: Holly Bridges DOB: 05/25/1990 MRN: [id number]  Date of admission: 06/20/2019 Delivering Provider:    Eugenia Mcalpine, Boy A Chavie [[id number]]  FAIR, CHELSEA N    Rugg, Boy B Felma [[id number]]  Barrington Ellison N   Date of discharge: 06/23/2019  Admitting diagnosis: Hypertension affecting pregnancy [O16.9] Pregnancy [Z34.90] Intrauterine pregnancy: [redacted]w[redacted]d    Secondary diagnosis:  Principal Problem:   Hypertension in pregnancy, severe preeclampsia, delivered Active Problems:   Supervision of high risk pregnancy, antepartum   Uses marijuana   Twin pregnancy, delivered vaginally, current hospitalization   Late prenatal care   Postpartum anemia   Rubella non-immune status, antepartum   Heart murmur   Acute on chronic anemia   Pregnancy affected by fetal growth restriction   Breech extraction, fetus 2   Preterm labor in third trimester with preterm delivery  Additional problems: None     Discharge diagnosis: Preterm Pregnancy Delivered, Preeclampsia (severe) and Anemia                                                                                Post partum procedures: Feraheme (refused blood transfusion)  Augmentation: AROM and Pitocin  Complications: possible placental abruption  Hospital course:  Onset of Labor With Preterm Vaginal Delivery     29y.o. yo [phone] 366w5das admitted in Active Labor on 06/20/2019. Patient had an uncomplicated labor course as follows: She was admitted for preterm labor with severe preeclampsia and possible abruption.  Initial SVE: 7.5/90/-1. Patient was given BMZ, Magnesium and Amp initiated. She received an epidural. After adequate GBS ppx, AROM performed. She then progressed to complete. Twin A delivered vertex and Twin B by breech extraction. Membrane Rupture Time/Date:    HiKinda, Pottleoy A Idania [[id number]]1:53 AM    Piggott, Boy B Lavine [[id number]]3:35 AM  ,   HiDacy, Enricooy A  Navi [[id number]]06/21/2019    HiKaytlin, Burklow0[[id number]]06/21/2019    Intrapartum Procedures: Episiotomy:    [place], Boy A Glendine [0[[id number]]None [1]    Laplante, Boy B Calandria [[id number]]None [1]                                          Lacerations:     Mollett, Boy A Kinleigh [[id number]]Labial [10]    Blick, Boy B Nakhia [[id number]]Labial [10]   Patient had a delivery of two Viable infants.   HiLatarsha, Zani0[[id number]]06/21/2019    HiNalea, Salce0[[id number]]06/21/2019  Information for the patient's newborn:  HiSam, Wunschel0[[id number]]Delivery Method: Vaginal, Spontaneous(Filed from Delivery Summary)  Information for the patient's newborn:  HiJoslynne, Klatt0[[id number]]Delivery Method: Vaginal, Breech(Filed from Delivery Summary)     Patient was continued on Magnesium for 24 hours. Norvasc 10 mg initiated and BPs were controlled; she only had an episode of severe BP after a verbal altercation.  SW consulted as UDS positive for  amphetamines, THC, benzos and opiates; patient declined intervention. Patient with acute blood loss anemia on chronic anemia (Hemoglobin went from 7.7 to 6.4) but she adamantly refused transfusion. She did receive one dose of Feraheme.  She also refused MMR vaccine that was offered given her rubella non-immune status.  She is ambulating, tolerating a regular diet, passing flatus, and urinating well. Patient is discharged home in stable condition on 06/23/19. Of note, patient desires interval BTS, this can be arranged postpartum.  Delivery time:    Katharyn, Schauer [[id number]]  3:33 AM    Goldia, Ligman [[id number]]  3:38 AM   Magnesium Sulfate received: Yes BMZ received: Yesx1 Rhophylac:No MMR:No as she declined Transfusion:No as she declined  Physical exam  Vitals:   06/22/19 1929 06/23/19 0010 06/23/19 0418 06/23/19 0838  BP: (!) 146/81 (!) 153/88 (!) 126/92 (!)  155/93  Pulse: 84 79 77 79  Resp: 20 19 20 19   Temp: 98.1 F (36.7 C) 98.4 F (36.9 C) 98.1 F (36.7 C) 98.1 F (36.7 C)  TempSrc: Oral Oral Oral Oral  SpO2: 98% 100% 98% 99%  Weight:      Height:       General: alert, cooperative and no distress Lochia: appropriate Uterine Fundus: firm Incision: N/A DVT Evaluation: No evidence of DVT seen on physical exam. Negative Homan's sign. No cords or calf tenderness. Labs: Lab Results  Component Value Date   WBC 19.9 (H) 06/21/2019   HGB 6.4 (LL) 06/21/2019   HCT 21.1 (L) 06/21/2019   MCV 76.4 (L) 06/21/2019   PLT 197 06/21/2019   CMP Latest Ref Rng & Units 06/21/2019  Glucose 70 - 99 mg/dL 126(H)  BUN 6 - 20 mg/dL 9  Creatinine 0.44 - 1.00 mg/dL 1.12(H)  Sodium 135 - 145 mmol/L 131(L)  Potassium 3.5 - 5.1 mmol/L 4.5  Chloride 98 - 111 mmol/L 103  CO2 22 - 32 mmol/L 20(L)  Calcium 8.9 - 10.3 mg/dL 7.0(L)  Total Protein 6.5 - 8.1 g/dL 5.1(L)  Total Bilirubin 0.3 - 1.2 mg/dL 0.6  Alkaline Phos 38 - 126 U/L 193(H)  AST 15 - 41 U/L 37  ALT 0 - 44 U/L 33   Edinburgh Score: No flowsheet data found.  Discharge instruction: per After Visit Summary and "Baby and Me Booklet".  After visit meds:  Allergies as of 06/23/2019      Reactions   Sulfa Antibiotics       Medication List    STOP taking these medications   aspirin EC 81 MG tablet   naproxen sodium 220 MG tablet Commonly known as: ALEVE     TAKE these medications   acetaminophen 500 MG tablet Commonly known as: TYLENOL Take 2 tablets (1,000 mg total) by mouth every 6 (six) hours as needed for mild pain, moderate pain or headache. What changed:   medication strength  how much to take  when to take this  reasons to take this   amLODipine 10 MG tablet Commonly known as: NORVASC Take 1 tablet (10 mg total) by mouth daily. Start taking on: Jun 24, 2019   Blood Pressure Monitor Kit Please check blood pressure 1-2 weeks.   cyclobenzaprine 10 MG  tablet Commonly known as: FLEXERIL Take 1 tablet (10 mg total) by mouth 3 (three) times daily as needed for muscle spasms.   docusate sodium 100 MG capsule Commonly known as: COLACE Take 1 capsule (100 mg total) by mouth 2 (two) times daily as needed for mild  constipation or moderate constipation.   ferrous sulfate 325 (65 FE) MG tablet Take 1 tablet (325 mg total) by mouth daily with breakfast. Start taking on: Jun 24, 2019   ibuprofen 800 MG tablet Commonly known as: ADVIL Take 1 tablet (800 mg total) by mouth 3 (three) times daily with meals as needed for headache, mild pain, moderate pain or cramping.   PRENATAL VITAMIN PO Take by mouth.       Diet: routine diet  Activity: Advance as tolerated. Pelvic rest for 6 weeks.    Future Appointments  Date Time Provider [place]  06/28/2019  1:10 PM [place] None  07/19/2019  1:00 PM Wood[place] Mode, MD [facility] None   Follow up Visit: Please schedule this patient for Postpartum visit in: 4 weeks with the following provider: MD In-Person For C/S patients schedule nurse incision check in weeks 2 weeks: no High risk pregnancy complicated by: HTN Delivery mode:  SVD Anticipated Birth Control:  Possible interval BTL PP Procedures needed: BP check, visit to discuss BTL Schedule Integrated [place] visit: no   Newborn Data:   Viona, Hosking [[id number]]  Live born female  Birth Weight: 4 lb 4.1 oz (1930 g) APGAR: 8, 9  Newborn Delivery   Birth date/time: 06/21/2019 03:33:00 Delivery type: Vaginal, Spontaneous       Kresse, Boy B Adlai [[id number]]  Live born female  Birth Weight: 3 lb 2.8 oz (1440 g) APGAR: 5, 8  Newborn Delivery   Birth date/time: 06/21/2019 03:38:00 Delivery type: Vaginal, Spontaneous      Baby Feeding: Bottle Disposition:NICU   06/23/2019 Verita Schneiders, MD

## 2019-06-21 NOTE — Progress Notes (Signed)
CRITICAL VALUE ALERT  Critical Value:  Hgb 6.4  Date & Time Notied:  06/21/2019  Provider Notified: Dr. Macon Large  Orders Received/Actions taken: No new orders at this time. MD to review.

## 2019-06-21 NOTE — Progress Notes (Signed)
CSW acknowledged consult and completed chart review. CSW will complete psychosocial assessment when MOB's magnesium is discontinued.   Karena Kinker, LCSW Clinical Social Worker [facility] Cell#: [phone] 

## 2019-06-21 NOTE — Progress Notes (Signed)
Faculty Practice OB/GYN Attending Note  Subjective:  Called to evaluate patient with postpartum anemia.  Hgb 6.4. Patient is sleepy after delivery earlier today of twins at [redacted]w[redacted]d in the setting of severe PEC. On magnesium sulfate. Havinf some abdominal cramping, no other acute concerns. No OOB yet.    Objective:  Blood pressure (!) 155/95, pulse 68, temperature 98.2 F (36.8 C), temperature source Axillary, resp. rate 20, height 5\' 4"  (1.626 m), weight 78 kg, last menstrual period 10/25/2018, SpO2 98 %, unknown if currently breastfeeding.   Intake/Output Summary (Last 24 hours) at 06/21/2019 0946 Last data filed at 06/21/2019 0818 Gross per 24 hour  Intake 2089.37 ml  Output 2421 ml  Net -331.63 ml   Gen: Sleepy HENT: Normocephalic, atraumatic Lungs: Normal respiratory effort Heart: Regular rate noted Abdomen: NT, firm fundus, soft Cervix: Deferred Ext: 2+ DTRs, no edema, no cyanosis, negative Homan's sign  Labs CBC Latest Ref Rng & Units 06/21/2019 06/20/2019 05/10/2019  WBC 4.0 - 10.5 K/uL 19.9(H) 8.3 8.8  Hemoglobin 12.0 - 15.0 g/dL 6.4(LL) 7.7(L) 5.6(LL)  Hematocrit 36.0 - 46.0 % 21.1(L) 25.8(L) 18.2(L)  Platelets 150 - 400 K/uL 197 214 260   CMP Latest Ref Rng & Units 06/21/2019 06/20/2019 05/15/2019  Glucose 70 - 99 mg/dL 05/17/2019) 82 82  BUN 6 - 20 mg/dL 9 11 11   Creatinine 0.44 - 1.00 mg/dL 323(F) ) 5.73(U  Sodium 135 - 145 mmol/L 131(L) 138 137  Potassium 3.5 - 5.1 mmol/L 4.5 3.9 3.2(L)  Chloride 98 - 111 mmol/L 103 106 105  CO2 22 - 32 mmol/L 20(L) 22 24  Calcium 8.9 - 10.3 mg/dL 7.0(L) 8.2(L) 7.6(L)  Total Protein 6.5 - 8.1 g/dL 5.1(L) 5.6(L) 5.4(L)  Total Bilirubin 0.3 - 1.2 mg/dL 0.6 0.6 0.4  Alkaline Phos 38 - 126 U/L 193(H) 221(H) 108  AST 15 - 41 U/L 37 34 15  ALT 0 - 44 U/L 33 27 11     Assessment & Plan:  29 y.o. 4.27 PPD#0 s/p VD of twins at [redacted]w[redacted]d, with severe PEC and anemia.  Was 7.7 before delivery, refused transfusion then and refuses it again today.  Said her previous transfusion experience was bad, made her feel worse.  She adamantly refuses it for now.  She does agree to get [facility], this was ordered.   Analgesia as needed; will discuss possible Subutex with her tomorrow given her positive UDS (positive opiates, benzodiazepines, amphetamines, THC).  Concerned about possible withdrawal and will watch out for this.  SW consult placed.  Amlodipine for BP control,  continue magnesium sulfate until early tomorrow morning Continue close observation.  [redacted]w[redacted]d, MD, FACOG Obstetrician & Gynecologist, [facility] for Jaynie Collins, [facility]

## 2019-06-21 NOTE — Progress Notes (Signed)
Labor Progress Note Holly Bridges is a 29 y.o. [phone] at [redacted]w[redacted]d presented for preterm labor with possible abruption. Also with severe Pre-E. S: Comfortable with epidural.   O:  BP (!) 148/96   Pulse 73   Temp 98.3 F (36.8 C) (Oral)   Resp 17   Ht 5\' 4"  (1.626 m)   Wt 78 kg   LMP 10/25/2018 (LMP Unknown)   SpO2 100%   BMI 29.52 kg/m  EFM:  A: 125, moderate variability, 15x15 accels, no decels, reactive B: 130, moderate variability, 15x15 accels, no decels, reactive TOCO: q30m  CVE: Dilation: 9 Effacement (%): 90 Station: 0 Presentation: Vertex Exam by:: Albert Hersch   A&P: 29 y.o. 26 [redacted]w[redacted]d here for preterm labor with possible abruption. Also with severe Pre-E. #Labor: Vertex/breech. AROM for clear fluid of Twin A at this exam. Adequate GBS ppx. Will start Pit as ctx spaced out. Will give TXA now. Anticipate SVDx2. Discussed breech extraction and patient agreeable. Cont Mag. #Pain: epidural #FWB: Cat I x2  #GBS pending; Amp  #Severe Pre-E: Cont Mag. Moderate to severe range pressures and has received Labetalol. Pr/Cr 0.18 and Cr 1.16. Plt 214. #UDS positive for opiates, benzos, amphetamines and THC; consult SW PP #Anemia: See note by Dr. [redacted]w[redacted]d. TXA ordered to be given prior to delivery. Hgb 7.7 on admission. 2 u PRBC and 1 u FFB on hold.   Adrian Blackwater, MD 1:59 AM

## 2019-06-22 ENCOUNTER — Encounter (HOSPITAL_COMMUNITY): Payer: Self-pay | Admitting: Family Medicine

## 2019-06-22 LAB — SURGICAL PATHOLOGY

## 2019-06-22 NOTE — Clinical Social Work Maternal (Signed)
CLINICAL SOCIAL WORK MATERNAL/CHILD NOTE  Patient Details  Name: Mathis Bud MRN: [id number] Date of Birth: 12/24/1990  Date:  02-Dec-2019  Clinical Social Worker Initiating Note:  Abundio Miu, [place] Date/Time: Initiated:  06/22/19/1128     Child's Name:  Currently working on naming children   Biological Parents:  Mother, Father(Father: Vitor Overbaugh)   Need for Interpreter:  None   Reason for Referral:  Other (Comment), Current Substance Use/Substance Use During Pregnancy  (NICU Admission)  Address:  [place] [address]    Phone number:  [phone] (home)     Additional phone number:   Household Members/Support Persons (HM/SP):       HM/SP Name Relationship DOB or Age  HM/SP -1        HM/SP -2        HM/SP -3        HM/SP -4        HM/SP -5        HM/SP -6        HM/SP -7        HM/SP -8          Natural Supports (not living in the home):      Professional Supports:     Employment: Self-employed   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   Pediatrician List:   [facility]      Pediatrician Fax Number:    Risk Factors/Current Problems:  DHHS Involvement , Substance Use    Cognitive State:  Able to Concentrate , Alert , Goal Oriented , Linear Thinking    Mood/Affect:  Interested , Apprehensive , Calm    CSW Assessment: CSW met with MOB at bedside to discuss substance use during pregnancy and infants NICU admissions. CSW introduced self and explained reason for visit.  MOB initially presented apprehensive but became more open as the assessment continued. MOB had goal oriented speech and remained engaged during the assessment. MOB reported that she resides alone and is self employed. 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 MOB reported that she has all items to care for infants except car seats and swings. MOB reported that she will be able to get car seats for infants. CSW inquired about MOB's support system, MOB denied any supports. CSW asked if FOB will be involved, MOB reported that she is not sure. CSW asked about MOB's other children, MOB reported that all three of her daughters are staying with family. CSW inquired about the names of MOB's children, MOB reported [place], [place] and [place]. CSW inquired about last names and birthdays, MOB provided Roland Earl 10/25/05 then asked why CSW needed this information. CSW explained that due to MOB's substance use during pregnancy, infants are being drug screened and if a CPS report needs to be made CSW will need all of the children's names. MOB reported that we can talk about that later  then. Per chart review, Seward Carol was born 04/05/13. CSW was unable to find [place]'s last name or records of [place].   CSW inquired about MOB's mental health history, MOB denied any mental health history. MOB denied any history of postpartum depression with older 3 children. CSW inquired about how MOB was feeling emotionally after giving birth, MOB reported that she is feeling fine. MOB was initially apprehensive when speaking with CSW but became more open later in the assessment. MOB did not demonstrate any acute mental health signs/symptoms. CSW assessed for safety, MOB denied SI, HI and domestic violence.   CSW provided education regarding the baby blues period vs. perinatal mood disorders, discussed treatment and gave resources for mental health follow up if concerns arise.  CSW recommends self-evaluation during the  postpartum time period using the New Mom Checklist from Postpartum Progress and encouraged MOB to contact a medical professional if symptoms are noted at any time.    CSW provided review of Sudden Infant Death Syndrome (SIDS) precautions. MOB reported that she has 2 cribs for infants.   CSW and MOB discussed infants NICU admission. CSW informed MOB about the NICU, what to expect and resources/supports available while infants are admitted to the NICU. MOB reported that she has not talked to anyone about infants care. CSW agreed to ask infants NP to speak with MOB regarding infants care, MOB agreeable. MOB denied any transportation barriers with coming to visit infants after she is discharged. MOB denied any questions/concerns regarding the NICU.   CSW re-informed MOB about the hospital drug screen policy due to substance use during pregnancy. MOB reported that she smokes marijuana and her last use was the day before she went into labor. MOB reported that she also took a xanax and a percocet 10 within the week she went into labor. MOB denied any other substance use during pregnancy. MOB reported that she does not have a substance use problem. CSW did not offer substance use treatment resources as MOB did not identify her substance use as a problem. CSW informed MOB that infants UDS's were negative and that CDS would continue to be monitored a CPS report would be made if warranted. MOB asked what did that mean. CSW explained that if infants CDS were positive for illegal or non-prescribed substances  a CPS report would be made and CPS would create a safety plan with MOB, MOB verbalized understanding. CSW inquired about CPS history. MOB reported that she currently has temporary custody cases for her two youngest daughters. MOB reported that her youngest daughter (Arabella) stays with her brother and brother's wife and she plans to let her stay with them. MOB reported that she is working on regaining custody of her  daughter Seward Carol) and that her CPS case is open in [place]. MOB reported that she is able to talk to and see her daughter. CSW positively affirmed MOB working her CPS plan to regain custody. MOB reported that her oldest daughter Fabio Bering) lives with her father and she has 49% custody and her father will not allow her to see her daughter. MOB reported that she has to go back to court about that, noting she hasn't seen her daughter in two years. CSW acknowledged that it has been a Mikka Kissner time without seeing her daughter. CSW acknowledged that MOB had limited prenatal care and inquired about barriers. MOB reported no barriers and she just had a late start to prenatal care. MOB denied any barriers with getting infants to pediatrician visits. CSW  inquired about any needs/concerns. MOB reported none.   CSW updated infants NP that MOB requested an update, NP agreed to speak with MOB.   CSW will continue to offer resources/supports while infants are admitted to the NICU.   CSW Plan/Description:  Sudden Infant Death Syndrome (SIDS) Education, Perinatal Mood and Anxiety Disorder (PMADs) Education, [place], CSW Will Continue to Monitor Umbilical Cord Tissue Drug Screen Results and Make Report if Barbette Or, LCSW 11/14/19, 11:32 AM

## 2019-06-22 NOTE — Progress Notes (Signed)
Discussed that the patient was ordered for MMR and Tdap vaccines. Explained the importance of these and why we give it post-delivery. Patient wanted time to think about it. After re-evaluating the patient she decided she does not want injections at this time.

## 2019-06-22 NOTE — Progress Notes (Addendum)
Faculty Practice OB/GYN Attending Postpartum Note - PPD#1  Subjective:  Patient is doing much better today. Minimal abdominal cramping.  Minimal bleeding.  Patient denies any headaches, visual symptoms, RUQ/epigastric pain or other concerning symptoms. Babies are doing well in NICU as per patient.      Objective:  Blood pressure 131/73, pulse 69, temperature 98 F (36.7 C), temperature source Oral, resp. rate 18, height 5\' 4"  (1.626 m), weight 78 kg, last menstrual period 10/25/2018, SpO2 100 %, unknown if currently breastfeeding.   Intake/Output Summary (Last 24 hours) at 06/22/2019 1021 Last data filed at 06/22/2019 0423 Gross per 24 hour  Intake 3326.97 ml  Output 9450 ml  Net -6123.03 ml   Gen: NAD HENT: Normocephalic, atraumatic Lungs: Normal respiratory effort Heart: Regular rate noted Abdomen: NT, firm fundus, soft Ext: 2+ DTRs, no edema, no cyanosis, negative Homan's sign  Labs CBC Latest Ref Rng & Units 06/21/2019 06/20/2019 05/10/2019  WBC 4.0 - 10.5 K/uL 19.9(H) 8.3 8.8  Hemoglobin 12.0 - 15.0 g/dL 6.4(LL) 7.7(L) 5.6(LL)  Hematocrit 36.0 - 46.0 % 21.1(L) 25.8(L) 18.2(L)  Platelets 150 - 400 K/uL 197 214 260   CMP Latest Ref Rng & Units 06/21/2019 06/20/2019 05/15/2019  Glucose 70 - 99 mg/dL 05/17/2019) 82 82  BUN 6 - 20 mg/dL 9 11 11   Creatinine 0.44 - 1.00 mg/dL 546(E) ) 7.03(J  Sodium 135 - 145 mmol/L 131(L) 138 137  Potassium 3.5 - 5.1 mmol/L 4.5 3.9 3.2(L)  Chloride 98 - 111 mmol/L 103 106 105  CO2 22 - 32 mmol/L 20(L) 22 24  Calcium 8.9 - 10.3 mg/dL 7.0(L) 8.2(L) 7.6(L)  Total Protein 6.5 - 8.1 g/dL 5.1(L) 5.6(L) 5.4(L)  Total Bilirubin 0.3 - 1.2 mg/dL 0.6 0.6 0.4  Alkaline Phos 38 - 126 U/L 193(H) 221(H) 108  AST 15 - 41 U/L 37 34 15  ALT 0 - 44 U/L 33 27 11     Assessment & Plan:  29 y.o. 8.18 PPD#1 s/p VD of twins at [redacted]w[redacted]d, with severe PEC and anemia.   - Continue Amlodipine for BP control, this is stable -Was 7.7 before delivery, 6.4 afterwards,  refused transfusion. This is acute blood loss postpartum anemia on top of chronic anemia.   Had Feraheme yesterday.  No anemia symptoms.   - Discussed positive UDS; she reports a friend gave her a Percocet 10 and Xanax because she was in pain due to PTL. Declines chronic use of opiates and benzodiazepines, also denied amphetamine use but does endorse THC use.  Declined help with Subutex. Awaiting SW consultation. - Continue close observation, possible discharge tomorrow if remains stable.   [id number], MD, FACOG Obstetrician & Gynecologist, [facility] for Jaynie Collins, [facility]

## 2019-06-23 ENCOUNTER — Ambulatory Visit: Payer: Self-pay

## 2019-06-23 MED ORDER — FERROUS SULFATE 325 (65 FE) MG PO TABS: 325.0000 mg | ORAL_TABLET | Freq: Every day | ORAL | 3 refills | Status: DC

## 2019-06-23 MED ORDER — ACETAMINOPHEN 500 MG PO TABS: 1000.0000 mg | ORAL_TABLET | Freq: Four times a day (QID) | ORAL | 1 refills | Status: DC | PRN

## 2019-06-23 MED ORDER — AMLODIPINE BESYLATE 10 MG PO TABS: 10.0000 mg | ORAL_TABLET | Freq: Every day | ORAL | 2 refills | Status: DC

## 2019-06-23 MED ORDER — CYCLOBENZAPRINE HCL 10 MG PO TABS: 10.0000 mg | ORAL_TABLET | Freq: Three times a day (TID) | ORAL | 0 refills | Status: DC | PRN

## 2019-06-23 MED ORDER — DOCUSATE SODIUM 100 MG PO CAPS
100.0000 mg | ORAL_CAPSULE | Freq: Two times a day (BID) | ORAL | 2 refills | Status: DC | PRN
Start: 2019-06-23 — End: 2020-10-22

## 2019-06-23 MED ORDER — IBUPROFEN 800 MG PO TABS: 800.0000 mg | ORAL_TABLET | Freq: Three times a day (TID) | ORAL | 2 refills | Status: DC | PRN

## 2019-06-23 MED ORDER — CYCLOBENZAPRINE HCL 10 MG PO TABS: 10.0000 mg | ORAL_TABLET | Freq: Three times a day (TID) | ORAL | Status: DC | PRN

## 2019-06-23 MED FILL — ACETAMINOPHEN 500MG XT STRE: 500 | 8 days supply | Qty: 60 | Fill #0

## 2019-06-23 MED FILL — FERROUS SULFATE 325 MG TAB: 325 (65 FE) | 30 days supply | Qty: 30 | Fill #0

## 2019-06-23 MED FILL — CYCLOBENZAPRINE 10 MG TAB: 10 | 10 days supply | Qty: 30 | Fill #0

## 2019-06-23 MED FILL — DOK 100 MG CAPS: 100 | 15 days supply | Qty: 30 | Fill #0

## 2019-06-23 MED FILL — AMLODIPINE BESYLATE 10 MG T: 10 | 30 days supply | Qty: 30 | Fill #0

## 2019-06-23 MED FILL — IBUPROFEN 800 MG TAB: 800 | 10 days supply | Qty: 30 | Fill #0

## 2019-06-23 NOTE — Plan of Care (Signed)
  Problem: Clinical Measurements: Goal: Ability to maintain clinical measurements within normal limits will improve Outcome: Adequate for Discharge  Patient instructed by Dr. Macon Large on taking BP meds. Meds to bed to help pt. With discharge medications.

## 2019-06-23 NOTE — Lactation Note (Signed)
This note was copied from a baby's chart. Lactation Consultation Note  Patient Name: [name] Today's Date: 06/23/2019  Attempt to see mom in infants room.  RN reported mom wanted to initiate pumping.  Took all equipment needed.  Mom not here. RN called her and she reported she is eating now and asked how long it would take.  I let RN know if mom wanted to be seen to call back and I could come back.  Mom had positive urine screen for amphetamines, Benzos, opiates and THC on 04/27 at 20:30.  RN reports peds is aware.Urged to call lactation as needed.   Maternal Data    Feeding Feeding Type: Formula  LATCH Score                   Interventions    Lactation Tools Discussed/Used     Consult Status      Holly Bridges Michaelle Copas 06/23/2019, 7:28 PM

## 2019-06-23 NOTE — Discharge Instructions (Signed)
Postpartum Care After Vaginal Delivery This sheet gives you information about how to care for yourself from the time you deliver your baby to up to 6-12 weeks after delivery (postpartum period). Your health care provider may also give you more specific instructions. If you have problems or questions, contact your health care provider. Follow these instructions at home: Vaginal bleeding  It is normal to have vaginal bleeding (lochia) after delivery. Wear a sanitary pad for vaginal bleeding and discharge. ? During the first week after delivery, the amount and appearance of lochia is often similar to a menstrual period. ? Over the next few weeks, it will gradually decrease to a dry, yellow-brown discharge. ? For most women, lochia stops completely by 4-6 weeks after delivery. Vaginal bleeding can vary from woman to woman.  Change your sanitary pads frequently. Watch for any changes in your flow, such as: ? A sudden increase in volume. ? A change in color. ? Large blood clots.  If you pass a blood clot from your vagina, save it and call your health care provider to discuss. Do not flush blood clots down the toilet before talking with your health care provider.  Do not use tampons or douches until your health care provider says this is safe.  If you are not breastfeeding, your period should return 6-8 weeks after delivery. If you are feeding your child breast milk only (exclusive breastfeeding), your period may not return until you stop breastfeeding. Perineal care  Keep the area between the vagina and the anus (perineum) clean and dry as told by your health care provider. Use medicated pads and pain-relieving sprays and creams as directed.  If you had a cut in the perineum (episiotomy) or a tear in the vagina, check the area for signs of infection until you are healed. Check for: ? More redness, swelling, or pain. ? Fluid or blood coming from the cut or tear. ? Warmth. ? Pus or a bad  smell.  You may be given a squirt bottle to use instead of wiping to clean the perineum area after you go to the bathroom. As you start healing, you may use the squirt bottle before wiping yourself. Make sure to wipe gently.  To relieve pain caused by an episiotomy, a tear in the vagina, or swollen veins in the anus (hemorrhoids), try taking a warm sitz bath 2-3 times a day. A sitz bath is a warm water bath that is taken while you are sitting down. The water should only come up to your hips and should cover your buttocks. Breast care  Within the first few days after delivery, your breasts may feel heavy, full, and uncomfortable (breast engorgement). Milk may also leak from your breasts. Your health care provider can suggest ways to help relieve the discomfort. Breast engorgement should go away within a few days.  If you are breastfeeding: ? Wear a bra that supports your breasts and fits you well. ? Keep your nipples clean and dry. Apply creams and ointments as told by your health care provider. ? You may need to use breast pads to absorb milk that leaks from your breasts. ? You may have uterine contractions every time you breastfeed for up to several weeks after delivery. Uterine contractions help your uterus return to its normal size. ? If you have any problems with breastfeeding, work with your health care provider or lactation consultant.  If you are not breastfeeding: ? Avoid touching your breasts a lot. Doing this can make   your breasts produce more milk. ? Wear a good-fitting bra and use cold packs to help with swelling. ? Do not squeeze out (express) milk. This causes you to make more milk. Intimacy and sexuality  Ask your health care provider when you can engage in sexual activity. This may depend on: ? Your risk of infection. ? How fast you are healing. ? Your comfort and desire to engage in sexual activity.  You are able to get pregnant after delivery, even if you have not had  your period. If desired, talk with your health care provider about methods of birth control (contraception). Medicines  Take over-the-counter and prescription medicines only as told by your health care provider.  If you were prescribed an antibiotic medicine, take it as told by your health care provider. Do not stop taking the antibiotic even if you start to feel better. Activity  Gradually return to your normal activities as told by your health care provider. Ask your health care provider what activities are safe for you.  Rest as much as possible. Try to rest or take a nap while your baby is sleeping. Eating and drinking   Drink enough fluid to keep your urine pale yellow.  Eat high-fiber foods every day. These may help prevent or relieve constipation. High-fiber foods include: ? Whole grain cereals and breads. ? Brown rice. ? Beans. ? Fresh fruits and vegetables.  Do not try to lose weight quickly by cutting back on calories.  Take your prenatal vitamins until your postpartum checkup or until your health care provider tells you it is okay to stop. Lifestyle  Do not use any products that contain nicotine or tobacco, such as cigarettes and e-cigarettes. If you need help quitting, ask your health care provider.  Do not drink alcohol, especially if you are breastfeeding. General instructions  Keep all follow-up visits for you and your baby as told by your health care provider. Most women visit their health care provider for a postpartum checkup within the first 3-6 weeks after delivery. Contact a health care provider if:  You feel unable to cope with the changes that your child brings to your life, and these feelings do not go away.  You feel unusually sad or worried.  Your breasts become red, painful, or hard.  You have a fever.  You have trouble holding urine or keeping urine from leaking.  You have little or no interest in activities you used to enjoy.  You have not  breastfed at all and you have not had a menstrual period for 12 weeks after delivery.  You have stopped breastfeeding and you have not had a menstrual period for 12 weeks after you stopped breastfeeding.  You have questions about caring for yourself or your baby.  You pass a blood clot from your vagina. Get help right away if:  You have chest pain.  You have difficulty breathing.  You have sudden, severe leg pain.  You have severe pain or cramping in your lower abdomen.  You bleed from your vagina so much that you fill more than one sanitary pad in one hour. Bleeding should not be heavier than your heaviest period.  You develop a severe headache.  You faint.  You have blurred vision or spots in your vision.  You have bad-smelling vaginal discharge.  You have thoughts about hurting yourself or your baby. If you ever feel like you may hurt yourself or others, or have thoughts about taking your own life, get help   right away. You can go to the nearest emergency department or call:  Your local emergency services (911 in the [place]  A suicide crisis helpline, such as the National Suicide Prevention Lifeline at 1-800-273-8255. This is open 24 hours a day. Summary  The period of time right after you deliver your newborn up to 6-12 weeks after delivery is called the postpartum period.  Gradually return to your normal activities as told by your health care provider.  Keep all follow-up visits for you and your baby as told by your health care provider. This information is not intended to replace advice given to you by your health care provider. Make sure you discuss any questions you have with your health care provider. Document Revised: 02/12/2017 Document Reviewed: 11/23/2016 Elsevier Patient Education  2020 [employer] Postpartum Hypertension Postpartum hypertension is high blood pressure that remains higher than normal after childbirth. You may not realize that you have  postpartum hypertension if your blood pressure is not being checked regularly. In most cases, postpartum hypertension will go away on its own, usually within a week of delivery. However, for some women, medical treatment is required to prevent serious complications, such as seizures or stroke. What are the causes? This condition may be caused by one or more of the following:  Hypertension that existed before pregnancy (chronic hypertension).  Hypertension that comes on as a result of pregnancy (gestational hypertension).  Hypertensive disorders during pregnancy (preeclampsia) or seizures in women who have high blood pressure during pregnancy (eclampsia).  A condition in which the liver, platelets, and red blood cells are damaged during pregnancy (HELLP syndrome).  A condition in which the thyroid produces too much hormones (hyperthyroidism).  Other rare problems of the nerves (neurological disorders) or blood disorders. In some cases, the cause may not be known. What increases the risk? The following factors may make you more likely to develop this condition:  Chronic hypertension. In some cases, this may not have been diagnosed before pregnancy.  Obesity.  Type 2 diabetes.  Kidney disease.  History of preeclampsia or eclampsia.  Other medical conditions that change the level of hormones in the body (hormonal imbalance). What are the signs or symptoms? As with all types of hypertension, postpartum hypertension may not have any symptoms. Depending on how high your blood pressure is, you may experience:  Headaches. These may be mild, moderate, or severe. They may also be steady, constant, or sudden in onset (thunderclap headache).  Changes in your ability to see (visual changes).  Dizziness.  Shortness of breath.  Swelling of your hands, feet, lower legs, or face. In some cases, you may have swelling in more than one of these locations.  Heart palpitations or a racing  heartbeat.  Difficulty breathing while lying down.  Decrease in the amount of urine that you pass. Other rare signs and symptoms may include:  Sweating more than usual. This lasts longer than a few days after delivery.  Chest pain.  Sudden dizziness when you get up from sitting or lying down.  Seizures.  Nausea or vomiting.  Abdominal pain. How is this diagnosed? This condition may be diagnosed based on the results of a physical exam, blood pressure measurements, and blood and urine tests. You may also have other tests, such as a CT scan or an MRI, to check for other problems of postpartum hypertension. How is this treated? If blood pressure is high enough to require treatment, your options may include:  Medicines to reduce blood pressure (  antihypertensives). Tell your health care provider if you are breastfeeding or if you plan to breastfeed. There are many antihypertensive medicines that are safe to take while breastfeeding.  Stopping medicines that may be causing hypertension.  Treating medical conditions that are causing hypertension.  Treating the complications of hypertension, such as seizures, stroke, or kidney problems. Your health care provider will also continue to monitor your blood pressure closely until it is within a safe range for you. Follow these instructions at home:  Take over-the-counter and prescription medicines only as told by your health care provider.  Return to your normal activities as told by your health care provider. Ask your health care provider what activities are safe for you.  Do not use any products that contain nicotine or tobacco, such as cigarettes and e-cigarettes. If you need help quitting, ask your health care provider.  Keep all follow-up visits as told by your health care provider. This is important. Contact a health care provider if:  Your symptoms get worse.  You have new symptoms, such as: ? A headache that does not get  better. ? Dizziness. ? Visual changes. Get help right away if:  You suddenly develop swelling in your hands, ankles, or face.  You have sudden, rapid weight gain.  You develop difficulty breathing, chest pain, racing heartbeat, or heart palpitations.  You develop severe pain in your abdomen.  You have any symptoms of a stroke. "BE FAST" is an easy way to remember the main warning signs of a stroke: ? B - Balance. Signs are dizziness, sudden trouble walking, or loss of balance. ? E - Eyes. Signs are trouble seeing or a sudden change in vision. ? F - Face. Signs are sudden weakness or numbness of the face, or the face or eyelid drooping on one side. ? A - Arms. Signs are weakness or numbness in an arm. This happens suddenly and usually on one side of the body. ? S - Speech. Signs are sudden trouble speaking, slurred speech, or trouble understanding what people say. ? T - Time. Time to call emergency services. Write down what time symptoms started.  You have other signs of a stroke, such as: ? A sudden, severe headache with no known cause. ? Nausea or vomiting. ? Seizure. These symptoms may represent a serious problem that is an emergency. Do not wait to see if the symptoms will go away. Get medical help right away. Call your local emergency services (911 in the [place] Do not drive yourself to the hospital. Summary  Postpartum hypertension is high blood pressure that remains higher than normal after childbirth.  In most cases, postpartum hypertension will go away on its own, usually within a week of delivery.  For some women, medical treatment is required to prevent serious complications, such as seizures or stroke. This information is not intended to replace advice given to you by your health care provider. Make sure you discuss any questions you have with your health care provider. Document Revised: 03/18/2018 Document Reviewed: 11/30/2016 Elsevier Patient Education  2020 [employer]  Inc[employer]  

## 2019-06-24 LAB — TYPE AND SCREEN
ABO/RH(D): A POS
Antibody Screen: NEGATIVE
Unit division: 0
Unit division: 0
Unit division: 0

## 2019-06-24 LAB — BPAM RBC
Blood Product Expiration Date: 202105252359
Blood Product Expiration Date: 202105262359
Blood Product Expiration Date: 202105262359
ISSUE DATE / TIME: 202104271844
ISSUE DATE / TIME: 202104271844
ISSUE DATE / TIME: 202104271844
Unit Type and Rh: 6200
Unit Type and Rh: 6200
Unit Type and Rh: 6200

## 2019-07-19 ENCOUNTER — Ambulatory Visit: Payer: Medicaid Other | Admitting: Obstetrics & Gynecology

## 2019-08-18 ENCOUNTER — Ambulatory Visit: Payer: Self-pay

## 2019-08-18 ENCOUNTER — Telehealth: Payer: Medicaid Other

## 2019-08-24 DIAGNOSIS — Z419 Encounter for procedure for purposes other than remedying health state, unspecified: Secondary | ICD-10-CM | POA: Diagnosis not present

## 2019-09-24 DIAGNOSIS — Z419 Encounter for procedure for purposes other than remedying health state, unspecified: Secondary | ICD-10-CM | POA: Diagnosis not present

## 2019-10-03 DIAGNOSIS — R748 Abnormal levels of other serum enzymes: Secondary | ICD-10-CM | POA: Diagnosis not present

## 2019-10-03 DIAGNOSIS — R270 Ataxia, unspecified: Secondary | ICD-10-CM | POA: Diagnosis not present

## 2019-10-03 DIAGNOSIS — R200 Anesthesia of skin: Secondary | ICD-10-CM | POA: Diagnosis not present

## 2019-10-03 DIAGNOSIS — S0990XA Unspecified injury of head, initial encounter: Secondary | ICD-10-CM | POA: Diagnosis not present

## 2019-10-03 DIAGNOSIS — I10 Essential (primary) hypertension: Secondary | ICD-10-CM | POA: Diagnosis not present

## 2019-10-03 DIAGNOSIS — F17210 Nicotine dependence, cigarettes, uncomplicated: Secondary | ICD-10-CM | POA: Diagnosis not present

## 2019-10-03 DIAGNOSIS — N3000 Acute cystitis without hematuria: Secondary | ICD-10-CM | POA: Diagnosis not present

## 2019-10-03 DIAGNOSIS — Z882 Allergy status to sulfonamides status: Secondary | ICD-10-CM | POA: Diagnosis not present

## 2019-10-03 DIAGNOSIS — R948 Abnormal results of function studies of other organs and systems: Secondary | ICD-10-CM | POA: Diagnosis not present

## 2019-10-25 DIAGNOSIS — Z419 Encounter for procedure for purposes other than remedying health state, unspecified: Secondary | ICD-10-CM | POA: Diagnosis not present

## 2019-11-24 DIAGNOSIS — Z419 Encounter for procedure for purposes other than remedying health state, unspecified: Secondary | ICD-10-CM | POA: Diagnosis not present

## 2019-12-25 DIAGNOSIS — Z419 Encounter for procedure for purposes other than remedying health state, unspecified: Secondary | ICD-10-CM | POA: Diagnosis not present

## 2020-01-24 DIAGNOSIS — Z419 Encounter for procedure for purposes other than remedying health state, unspecified: Secondary | ICD-10-CM | POA: Diagnosis not present

## 2020-02-24 DIAGNOSIS — Z419 Encounter for procedure for purposes other than remedying health state, unspecified: Secondary | ICD-10-CM | POA: Diagnosis not present

## 2020-02-24 NOTE — L&D Delivery Note (Signed)
OB/GYN Faculty Practice Delivery Note  Holly Bridges is a 29 y.o. [id number] s/p SVD at [redacted]w[redacted]d. She was admitted for SOL, however had chronic hypertension with superimposed mild pre-eclampsia.   ROM: 5h 59m with light meconium fluid GBS Status: NEGATIVE/-- (12/17 1250) Maximum Maternal Temperature: 98.70F  Labor Progress: Initial SVE: 5.5/70/-1. She then progressed to complete with assistance of AROM.   Delivery Date/Time: 12/17 at 2339 Delivery: Called to room and patient was complete. After one push, the head delivered LOA. No nuchal cord present. Shoulder and body delivered in usual fashion. Infant with spontaneous cry, placed on mother's abdomen, dried and stimulated. Cord clamped x 2 after 1-minute delay, and cut by FOB. Cord blood drawn. Placenta delivered spontaneously with gentle cord traction. Fundus firm with massage and Pitocin. Prophylactic TXA was given. Manual lower uterine sweep without clots and lower uterine segment becoming more firm. Labia, perineum, vagina, and cervix inspected with no lacerations.  Baby Weight: pending  Placenta: 3 vessel, intact. Sent to L&D Complications: None Lacerations: None EBL: 125 mL Analgesia: Epidural   Infant:  APGAR (1 MIN): 9   APGAR (5 MINS): 9    Leticia Penna, DO  OB Family Medicine Fellow, [facility] for [facility], [facility] 02/09/2021, 12:16 AM

## 2020-03-26 DIAGNOSIS — Z419 Encounter for procedure for purposes other than remedying health state, unspecified: Secondary | ICD-10-CM | POA: Diagnosis not present

## 2020-04-04 IMAGING — US US MFM OB DETAIL+14 WK
1 series · 13 of 28 positions shown · non-contrast
Comparison: none

[Series 1: us mfm ob detail+14 wk · 13 of 104 slices shown]
[im 4/104]
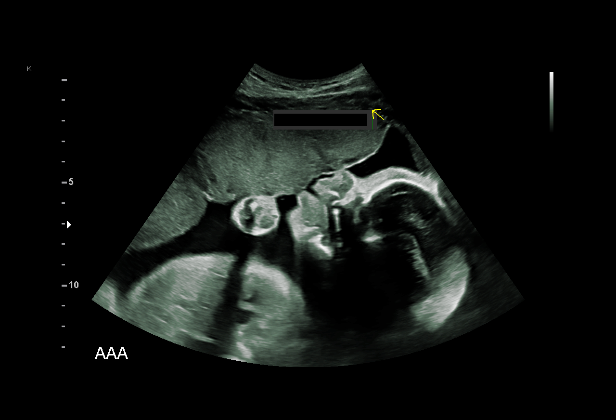
[im 12/104]
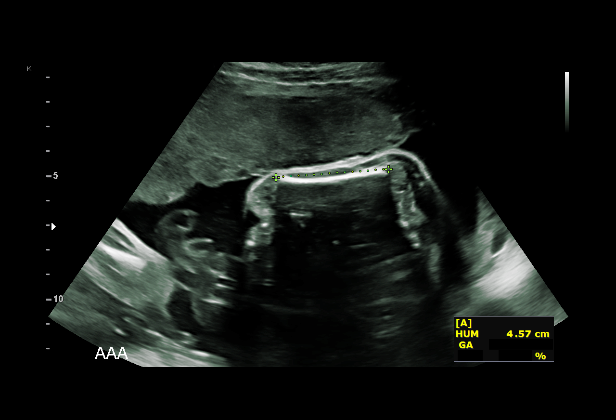
[im 20/104]
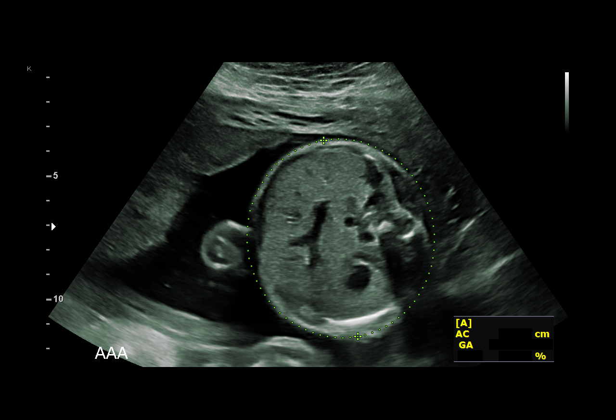
[im 27/104]
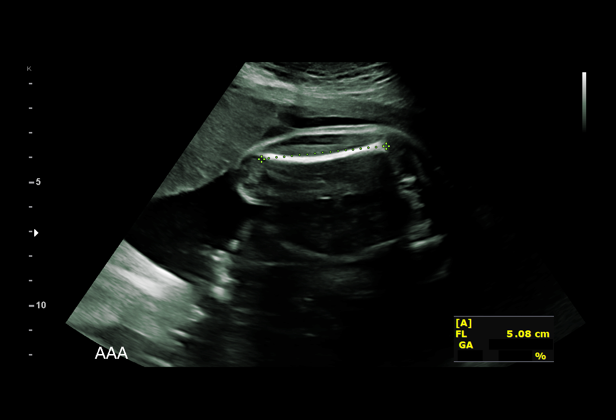
[im 35/104]
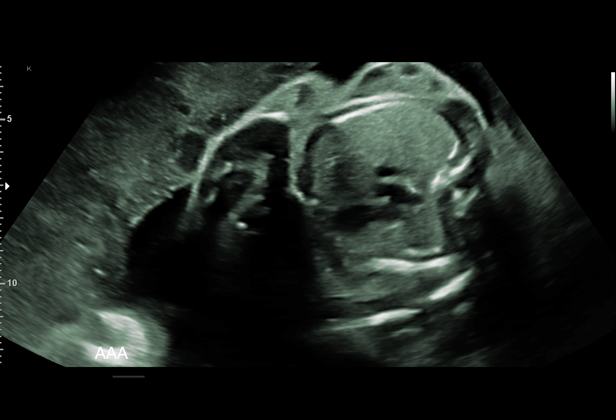
[im 42/104]
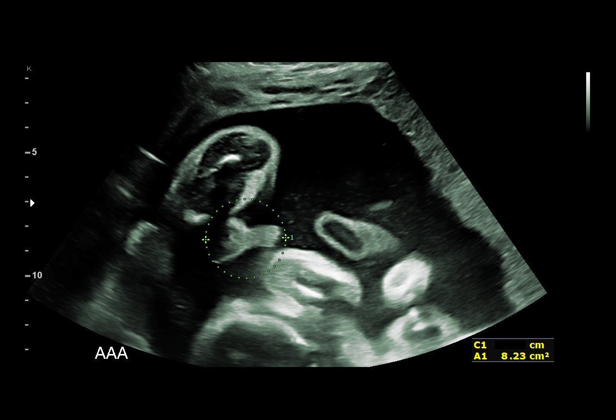
[im 54/104]
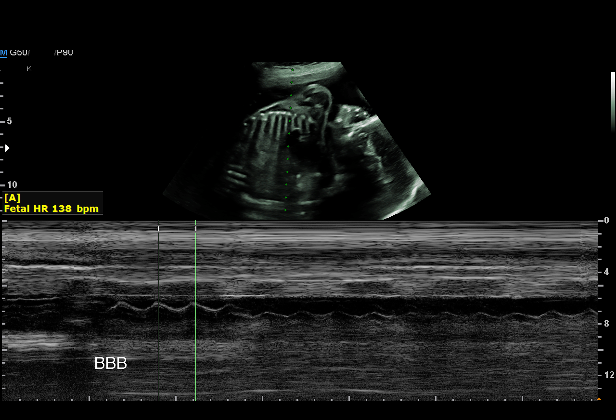
[im 62/104]
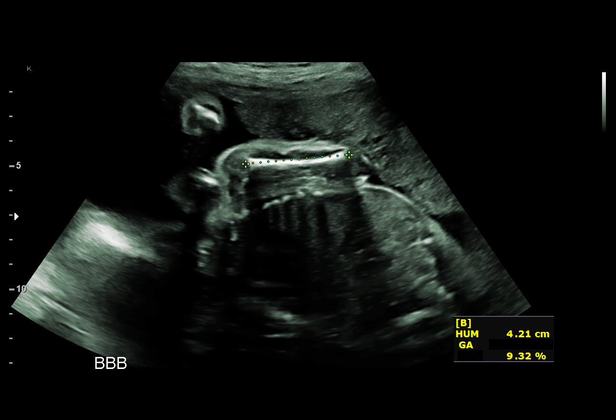
[im 69/104]
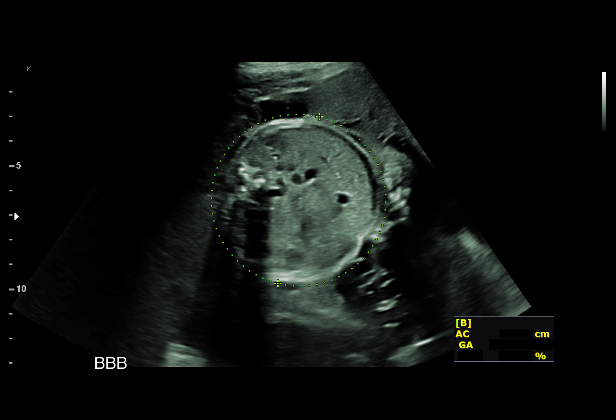
[im 77/104]
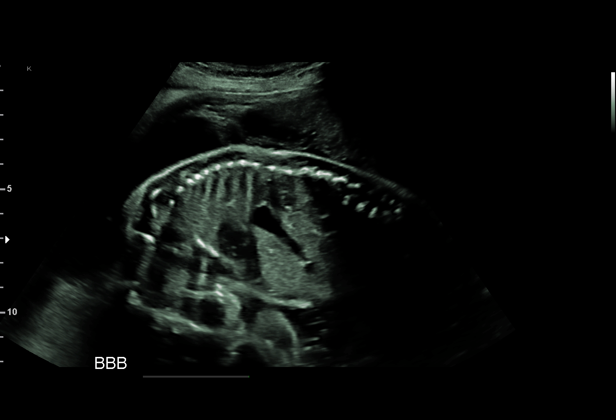
[im 84/104]
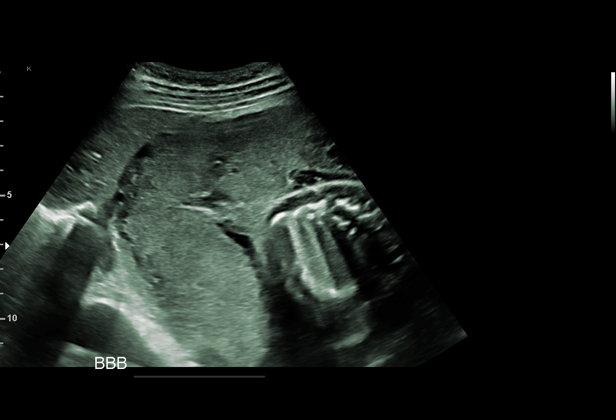
[im 92/104]
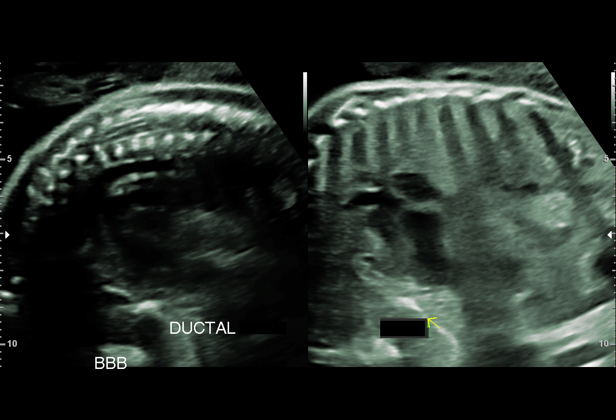
[im 100/104]
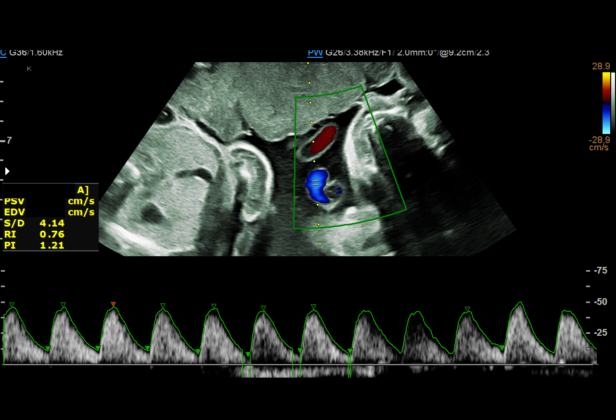

[13 of 28 positions shown; findings below may reference images not displayed]

Attending:        Herodes Nadies        Secondary Phy.:   [facility]
                                                            Care

     +14 WK
  3  US MFM UA CORD DOPPLER               76820.02     CHIOMA CECENA
  4  US MFM UA DOPPLER ADDL               76820.03     CHIOMA CECENA
     SEAN MOUNTBELLEW RE EVAL
 ----------------------------------------------------------------------

 ----------------------------------------------------------------------
Indications

  Twin pregnancy, di/di, second trimester
  Anemia during pregnancy in second trimester
  Insufficient Prenatal Care
  28 weeks gestation of pregnancy
  Smoking complicating pregnancy, second
  trimester
  Marijuana Use
  Poor obstetric history: Previous preterm
  delivery, antepartum PPROM 36 weeks
  Encounter for uncertain dates
 ----------------------------------------------------------------------
Fetal Evaluation (Fetus A)

 Num Of Fetuses:         2
 Fetal Heart Rate(bpm):  138
 Cardiac Activity:       Observed
 Fetal Lie:              Maternal left side
 Presentation:           Cephalic
 Placenta:               Anterior
 P. Cord Insertion:      Not well visualized
 Membrane Desc:      Dividing Membrane seen - Dichorionic.
 Amniotic Fluid
 AFI FV:      Subjectively upper-normal

                             Largest Pocket(cm)

Biometry (Fetus A)

 BPD:      72.6  mm     G. Age:  29w 1d         56  %    CI:        73.15   %    70 - 86
                                                         FL/HC:      18.5   %    19.6 -
 HC:      269.8  mm     G. Age:  29w 3d         43  %    HC/AC:      1.11        0.99 -
 AC:      242.4  mm     G. Age:  28w 4d         41  %    FL/BPD:     68.6   %    71 - 87
 FL:       49.8  mm     G. Age:  26w 6d          4  %    FL/AC:      20.5   %    20 - 24
 HUM:      46.1  mm     G. Age:  27w 1d         16  %
 CER:        31  mm     G. Age:  27w 0d         26  %

 LV:        5.4  mm

 Est. FW:    6651  gm      2 lb 9 oz     21  %     FW Discordancy     0 \ 25 %
OB History

 Gravidity:    4         Term:   2        Prem:   1
 Living:       3
Gestational Age (Fetus A)

 Clinical EDD:  26w 6d                                        EDD:   08/16/19
 U/S Today:     28w 4d                                        EDD:   08/04/19
 Best:          28w 4d     Det. By:  U/S Fetus A (05/16/19)   EDD:   08/04/19
Anatomy (Fetus A)

 Cranium:               Appears normal         LVOT:                   Appears normal
 Cavum:                 Appears normal         Aortic Arch:            Not well visualized
 Ventricles:            Appears normal         Ductal Arch:            Not well visualized
 Choroid Plexus:        Appears normal         Diaphragm:              Appears normal
 Cerebellum:            Appears normal         Stomach:                Appears normal, left
                                                                       sided
 Posterior Fossa:       Appears normal         Abdomen:                Appears normal
 Nuchal Fold:           Not applicable (>20    Abdominal Wall:         Appears nml (cord
                        wks GA)                                        insert, abd wall)
 Face:                  Appears normal         Cord Vessels:           Appears normal (3
                        (orbits and profile)                           vessel cord)
 Lips:                  Appears normal         Kidneys:                Appear normal
 Palate:                Appears normal         Bladder:                Appears normal
 Thoracic:              Appears normal         Spine:                  Not well visualized
 Heart:                 Appears normal         Upper Extremities:      Appears normal
                        (4CH, axis, and
                        situs)
 RVOT:                  Appears normal         Lower Extremities:      Appears normal

 Other:  Technically difficult due to advanced GA and fetal position.
Doppler - Fetal Vessels (Fetus A)

 Umbilical Artery
  S/D     %tile     RI              PI                     ADFV    RDFV
 4.49       95   0.78             1.27                        No      No
Fetal Evaluation (Fetus B)

 Num Of Fetuses:         2
 Fetal Heart Rate(bpm):  144
 Cardiac Activity:       Observed
 Fetal Lie:              Upper Fetus
 Presentation:           Transverse, head to maternal left
 Placenta:               Right lateral
 P. Cord Insertion:      Not well visualized

 Amniotic Fluid
 AFI FV:      Subjectively decreased

                             Largest Pocket(cm)

Biometry (Fetus B)

 BPD:      67.2  mm     G. Age:  27w 0d          5  %    CI:        72.56   %    70 - 86
                                                         FL/HC:      17.8   %    19.6 -
 HC:      250.9  mm     G. Age:  27w 2d          2  %    HC/AC:      1.14        0.99 -
 AC:      220.7  mm     G. Age:  26w 4d          3  %    FL/BPD:     66.4   %    71 - 87
 FL:       44.6  mm     G. Age:  24w 5d          0  %    FL/AC:      20.2   %    20 - 24
 HUM:      41.8  mm     G. Age:  25w 2d        < 5  %
 LV:        5.5  mm

 Est. FW:     876  gm    1 lb 15 oz       0  %     FW Discordancy        25  %
Gestational Age (Fetus B)

 Clinical EDD:  26w 6d                                        EDD:   08/16/19
 U/S Today:     26w 3d                                        EDD:   08/19/19
 Best:          28w 4d     Det. By:  U/S Fetus A (05/16/19)   EDD:   08/04/19
Anatomy (Fetus B)

 Cranium:               Appears normal         LVOT:                   Appears normal
 Cavum:                 Not well visualized    Aortic Arch:            Not well visualized
 Ventricles:            Appears normal         Ductal Arch:            Not well visualized
 Choroid Plexus:        Appears normal         Diaphragm:              Appears normal
 Cerebellum:            Appears normal         Stomach:                Appears normal, left
                                                                       sided
 Posterior Fossa:       Appears normal         Abdomen:                Appears normal
 Nuchal Fold:           Not applicable (>20    Abdominal Wall:         Appears nml (cord
                        wks GA)                                        insert, abd wall)
 Face:                  Not well visualized    Cord Vessels:           Appears normal (3
                                                                       vessel cord)
 Lips:                  Appears normal         Kidneys:                Appear normal
 Palate:                Not well visualized    Bladder:                Appears normal
 Thoracic:              Appears normal         Spine:                  Appears normal
 Heart:                 Appears normal         Upper Extremities:      Appears normal
                        (4CH, axis, and
                        situs)
 RVOT:                  Not well visualized    Lower Extremities:      Appears normal

 Other:  Technically difficult due to advanced GA and fetal position.
Doppler - Fetal Vessels (Fetus B)
 Umbilical Artery
  S/D     %tile     RI              PI                     ADFV    RDFV
 3.15       51   0.68             1.08                        No      No

Cervix Uterus Adnexa

 Cervix
 Not visualized (advanced GA >86wks)

 Left Ovary
 Not visualized.

 Right Ovary
 Not visualized.
Impression

 Addendum (05/25/2019):
 Ms. Juju returned today for umbilical artery Doppler study.
 She is G4 [id number] with dichorionic-diamniotic twin pregnancy.
 We dated her pregnancy based on her verbal report of an
 [facility].
 No detailed measurements were, apparently, performed and
 no formal report was generated.
 She is unsure of her LMP date.
 Patient had severe anemia and received blood transfusions
 at the hospital on 05/15/19.
 Obstetric history is significant for 2 term and 1 preterm
 delivery. All her children are in good health. She reports no
 chronic medical conditions. She tested positive for
 benzodiazepines and THC on urine toxicology. She has UTI
 (Streptococcus) and was prescribed amoxicillin.
 She missed her prenatal visit appointment at [HOSPITAL]
 yesterday.
 I informed her that based on her history, inadequate
 information of 19-week ultrasound and unsure LMP date, the
 pregnancy should be dated by OUR ultrasound measurement
 performed on 05/16/19. EDD calculated by larger twin (twin
 A) is 08/04/19 and based on this EDD, twin B has severe
 fetal growth restriction.
 I informed the patient that second-trimester dating of
 pregnancy is suboptimal, but given that lack of other
 supporting information, it is appropriate  we date her
 pregnancy by our ultrasound measurements.
 I explained fetal growth restriction of twin B and the need for
 frequent surveillance.
 Amended Final EDD: 08/04/2019
 performed earlier, we will revise EDD as needed.

 xxxxxxxxxxxxxxxxxxxxxxxxxxxxxxxxxxxxxxxxxxxxxxxxxxxxxxxxx
 x
 Ms. Lens Habu, Mitsuya4 P3 with twin pregnancy at unknown
 gestational age was admitted for blood transfusion to correct
 anemia.
 Her EDD was previously-established at 08/16/19.
 On ultrasound, it appears dichorionic-diamniotic twin
 pregnancy.
 Twi A: Maternal left, cephalic presentation, anterior placenta.
 The estimated fetal weight is at the 85th percentile (normal).
 Amniotic fluid is normal and good fetal activity is seen. Fetal
 anatomy appears normal but limited by advanced gestational
 age. Umbilical artery Doppler, performed because of growth
 discordancy, showed normal forward diastolic flow.
 Twin B: Upper fetus, transverse and head to maternal left,
 right lateral placenta. The estimated fetal weight is at the 11th
 percentile. Amniotic fluid is slightly decreased (no
 oligohydramnios). Fetal anatomy appears normal. Umbilical
 artery Doppler showed normal forward diastolic flow.
 Growth discordancy: 25%
 This study was remotely read and interpret
Recommendations

 -Follow-up umbilical artery Doppler studies in 2 weeks.
 -Fetal growth assessment in 3 weeks.
                      Harun Khan, Shanchai

## 2020-04-13 IMAGING — US US MFM UA CORD DOPPLER
1 series · 13 of 28 positions shown · non-contrast
Comparison: none

[Series 1: us mfm ua cord doppler · 46 acquisitions, 13 frames shown]
[im 2/46]
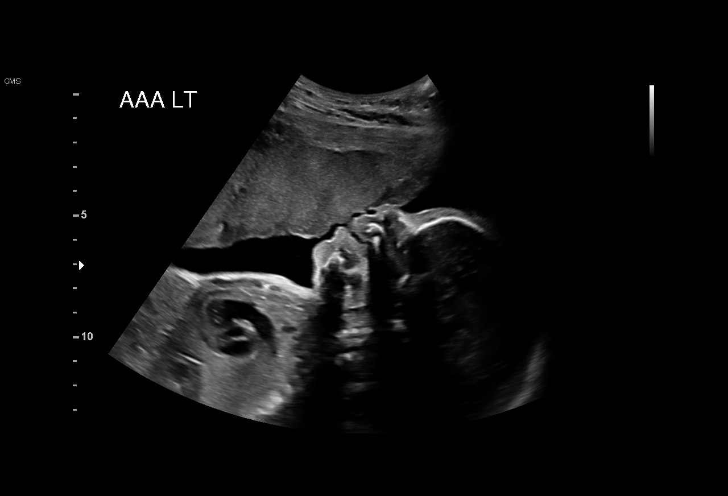
[im 6/46]
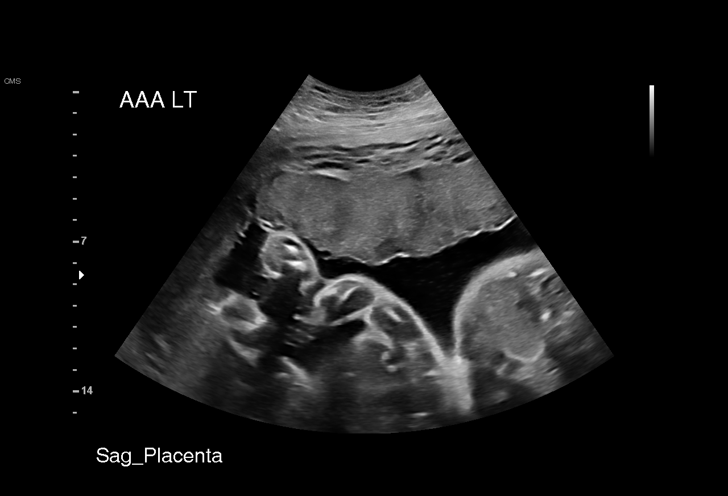
[im 9/46]
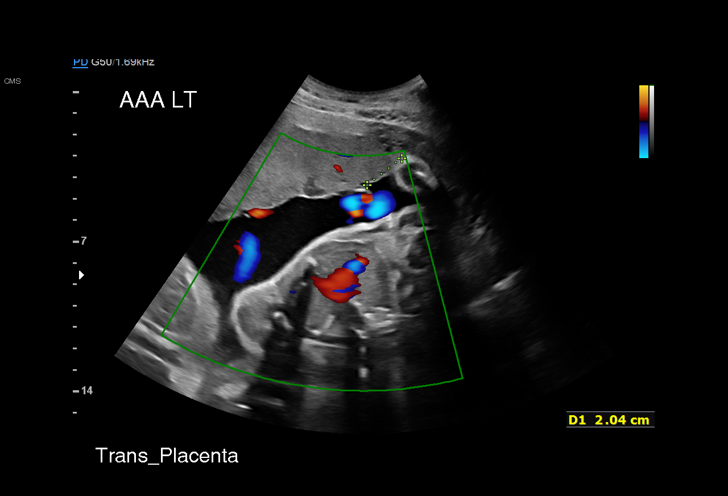
[im 12/46]
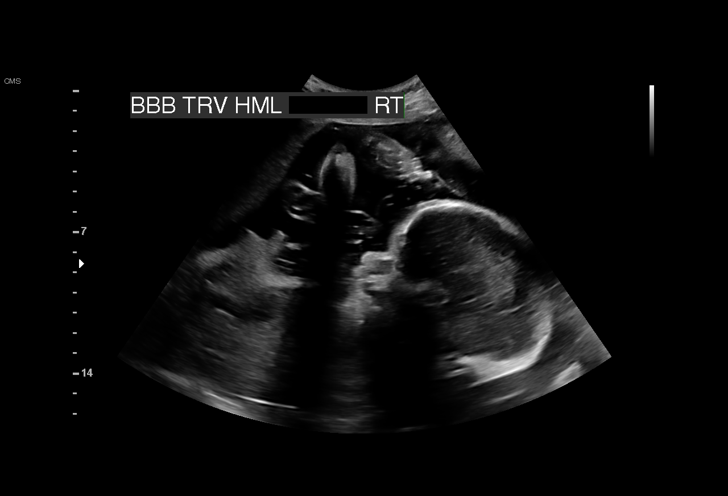
[im 16/46]
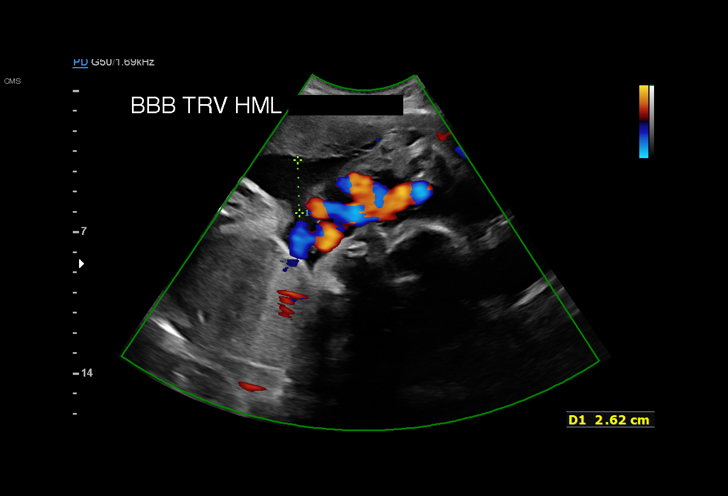
[im 19/46]
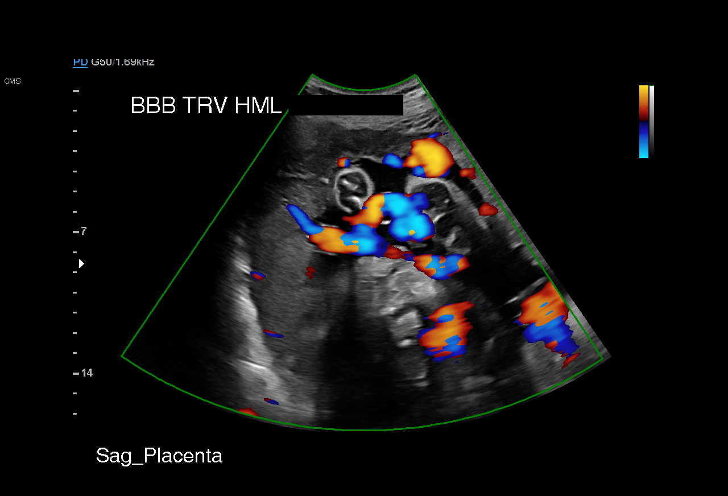
[im 24/46]
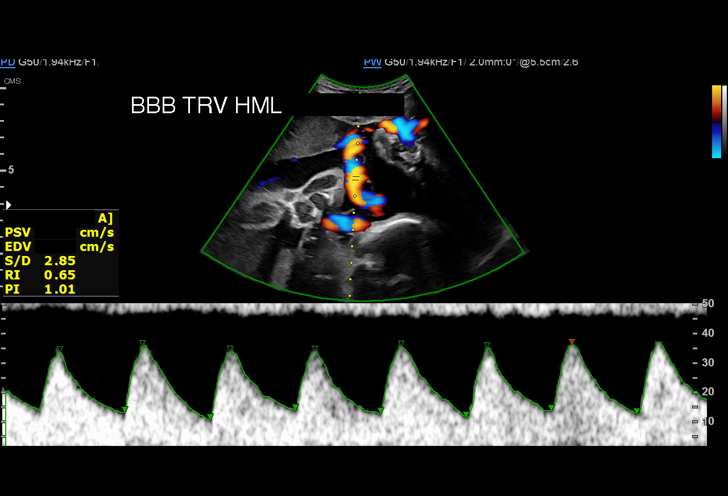
[im 27/46]
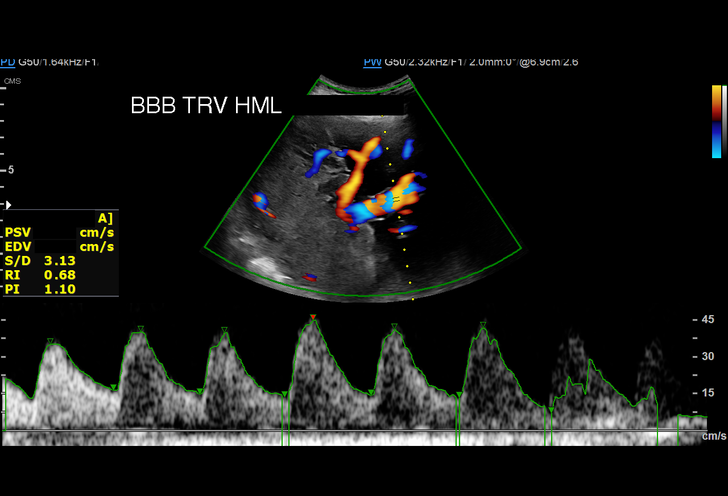
[im 31/46]
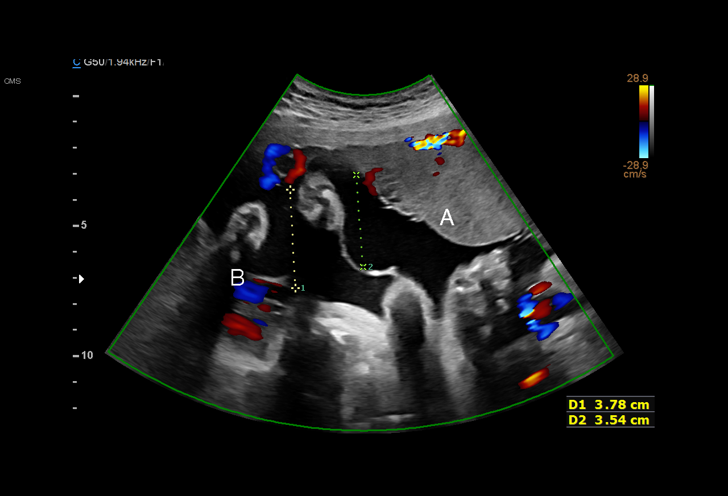
[im 34/46]
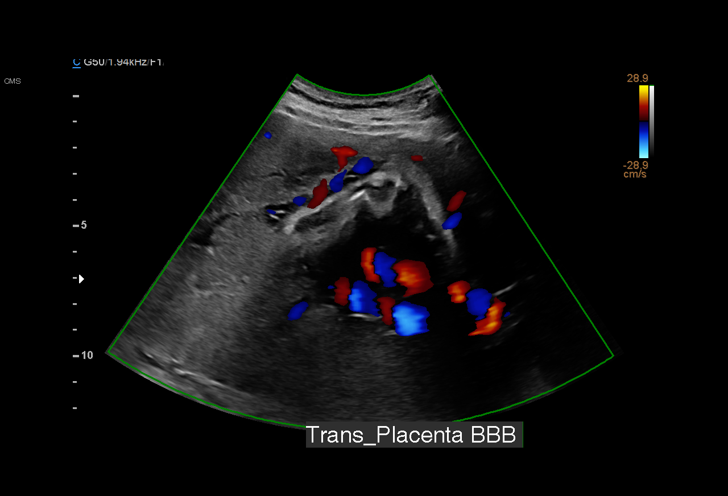
[im 37/46]
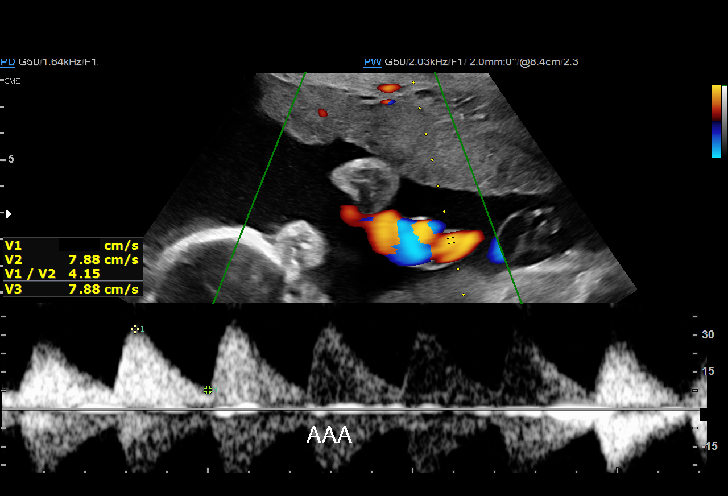
[im 41/46]
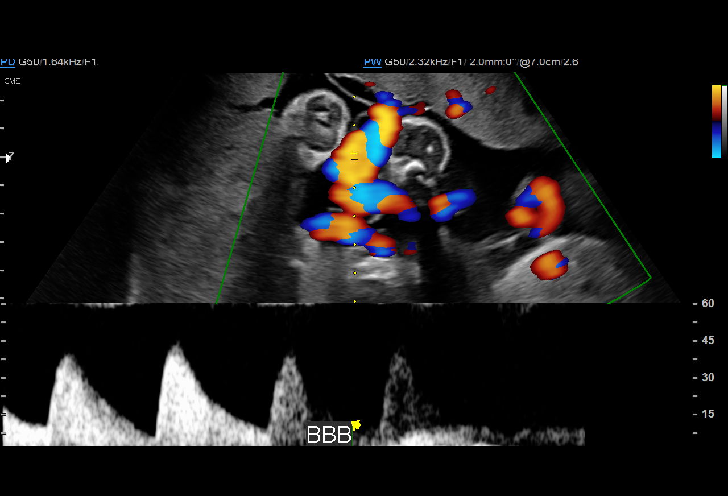
[im 44/46]
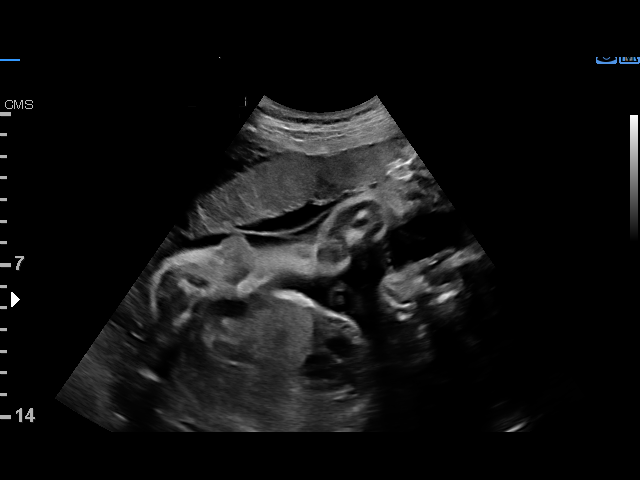

[13 of 28 positions shown; findings below may reference images not displayed]

1  US MFM UA CORD DOPPLER               76820.02     SIIRUVIIRU YUDANOVA
  2  US MFM UA ADDL GEST                  76820.01     SIIRUVIIRU YUDANOVA
  3  US MFM OB LIMITED                    76815.01     SIIRUVIIRU YUDANOVA
 ----------------------------------------------------------------------

 ----------------------------------------------------------------------
Indications

  Maternal care for known or suspected poor
  fetal growth, third trimester, fetus 2 IUGR
  Twin pregnancy, di/di, second trimester
  Anemia during pregnancy in second trimester
  Insufficient Prenatal Care
  Smoking complicating pregnancy, second
  trimester
  Marijuana Use
  Poor obstetric history: Previous preterm
  delivery, antepartum PPROM 36 weeks
  29 weeks gestation of pregnancy
 ----------------------------------------------------------------------
Fetal Evaluation (Fetus A)

 Num Of Fetuses:         2
 Fetal Heart Rate(bpm):  136
 Cardiac Activity:       Observed
 Fetal Lie:              Maternal left side
 Presentation:           Cephalic
 Placenta:               Anterior
 P. Cord Insertion:      Not well visualized
 Membrane Desc:      Dividing Membrane seen - Dichorionic.

 Amniotic Fluid
 AFI FV:      Within normal limits

                             Largest Pocket(cm)

OB History

 Gravidity:    4         Term:   2        Prem:   1
 Living:       3
Gestational Age (Fetus A)

 Clinical EDD:  28w 1d                                        EDD:   08/16/19
 Best:          29w 6d     Det. By:  U/S Fetus A              EDD:   08/04/19
                                     (05/16/19)
Doppler - Fetal Vessels (Fetus A)

 Umbilical Artery
  S/D     %tile     RI              PI                     ADFV    RDFV
 4.22       93   0.76             1.26                        No      No

Fetal Evaluation (Fetus B)

 Num Of Fetuses:         2
 Fetal Heart Rate(bpm):  129
 Cardiac Activity:       Observed
 Fetal Lie:              Upper Right Fetus
 Presentation:           Transverse, head to maternal left
 Placenta:               Right lateral
 P. Cord Insertion:      Not well visualized
 Membrane Desc:      Dividing Membrane seen

 Amniotic Fluid
 AFI FV:      Within normal limits

                             Largest Pocket(cm)

Gestational Age (Fetus B)

 Clinical EDD:  28w 1d                                        EDD:   08/16/19
 Best:          29w 6d     Det. By:  U/S Fetus A              EDD:   08/04/19
                                     (05/16/19)
Doppler - Fetal Vessels (Fetus B)

 Umbilical Artery
  S/D     %tile     RI              PI                     ADFV    RDFV
 3.46       73   0.71             1.15                        No      No

Impression

 Ms. Kumurya returned for umbilical artery Doppler study. She is
 G4 C0FW8 with dichorionic-diamniotic twin pregnancy. We
 dated her pregnancy based on her verbal report of an
 [facility].
 No detailed measurements were, apparently, performed and
 no formal report was generated.
 She is unsure of her LMP date.
 Patient had severe anemia and received blood transfusions
 at the hospital on 05/15/19.
 Obstetric history is significant for 2 term and 1 preterm
 delivery. All her children are in good health. She reports no
 chronic medical conditions. She tested positive for
 benzodiazepines and THC on urine toxicology. She has UTI
 (Streptococcus) and was prescribed amoxicillin.
 She missed her prenatal visit appointment at [HOSPITAL]
 yesterday.
 I informed her that based on her history, inadequate
 information of 19-week ultrasound and unsure LMP date, the
 pregnancy should be dated by OUR ultrasound measurement
 performed on 05/16/19. EDD calculated by larger twin (twin
 A) is 08/04/19 and based on this EDD, twin B has severe
 fetal growth restriction.
 I informed the patient that second-trimester dating of
 pregnancy is suboptimal, but given that lack of other
 supporting information, it is appropriate  we date her
 pregnancy by our ultrasound measurements.
 I explained fetal growth restriction of twin B and the need for
 frequent surveillance.
 On today's ultrasound,
 Twin A: Maternal left, cephalic presentation, anterior
 placenta. Amniotic fluid is normal and good fetal activity is
 seen. Umbilical artery Doppler showed normal forward
 diastolic flow.
 Twin B: Upper right fetus, transverse lie and head to maternal
 left, right lateral placenta. Amniotic fluid is normal and good
 fetal activity is seen. Umbilical artery Doppler showed normal
 forward diastolic flow.
 I reassured the patient of the findings and encouraged her to
 keep prenatal visit appointments.
 We later sent a message to [HOSPITAL] office to reschedule her
 appointment.
Recommendations

 -Fetal growth and UA Doppler in 2 weeks.
 -Weekly UA Doppler from her next visit.
 -See previous ultrasound addendum explaining reasons for
 amending EDD.
                 Prosperity, Mosweu

## 2020-04-14 ENCOUNTER — Ambulatory Visit: Admit: 2020-04-14 | Discharge status: Against Medical Advice | Payer: Medicaid Other

## 2020-04-15 ENCOUNTER — Ambulatory Visit: Payer: Self-pay

## 2020-04-15 ENCOUNTER — Ambulatory Visit (HOSPITAL_COMMUNITY): Payer: Self-pay

## 2020-04-23 DIAGNOSIS — Z419 Encounter for procedure for purposes other than remedying health state, unspecified: Secondary | ICD-10-CM | POA: Diagnosis not present

## 2020-05-24 DIAGNOSIS — Z419 Encounter for procedure for purposes other than remedying health state, unspecified: Secondary | ICD-10-CM | POA: Diagnosis not present

## 2020-06-23 DIAGNOSIS — Z419 Encounter for procedure for purposes other than remedying health state, unspecified: Secondary | ICD-10-CM | POA: Diagnosis not present

## 2020-07-12 ENCOUNTER — Encounter (HOSPITAL_COMMUNITY): Payer: Self-pay | Admitting: *Deleted

## 2020-07-12 ENCOUNTER — Other Ambulatory Visit: Payer: Self-pay

## 2020-07-12 ENCOUNTER — Emergency Department (HOSPITAL_COMMUNITY)
Admission: EM | Admit: 2020-07-12 | Discharge: 2020-07-12 | Disposition: A | Discharge status: Home / Self Care | Payer: Medicaid Other | Attending: Emergency Medicine | Admitting: Emergency Medicine

## 2020-07-12 DIAGNOSIS — R531 Weakness: Secondary | ICD-10-CM | POA: Diagnosis not present

## 2020-07-12 DIAGNOSIS — Z5321 Procedure and treatment not carried out due to patient leaving prior to being seen by health care provider: Secondary | ICD-10-CM | POA: Insufficient documentation

## 2020-07-12 MED ORDER — SODIUM CHLORIDE 0.9% FLUSH
3.0000 mL | Freq: Once | INTRAVENOUS | Status: DC
Start: 2020-07-12 — End: 2020-07-12

## 2020-07-12 NOTE — ED Notes (Signed)
Called pt again. No answer, charge and MD notified

## 2020-07-12 NOTE — ED Notes (Signed)
Several attempters to locate pt. Unsuccessfully. It is believed she left

## 2020-07-12 NOTE — ED Triage Notes (Signed)
Pt states hx of stroke w/L sided deficits.  States since her stroke, her L leg periodically just "gives out".  Last night at 1am she was walking and her L leg became numb, tingly and "gave out".  States she initially had a headache, but denies presently.

## 2020-07-12 NOTE — ED Notes (Addendum)
Spoke with Dr Stevie Kern.  Pt is outside window and is VAN negative.  He states to place stroke orders, but not call code stroke.

## 2020-07-12 NOTE — ED Provider Notes (Signed)
Patient not brought back to room, checked twice. Per nursing staff eloped.  Kenton Kingfisher, MD 07/12/20 [phone]

## 2020-07-24 DIAGNOSIS — Z419 Encounter for procedure for purposes other than remedying health state, unspecified: Secondary | ICD-10-CM | POA: Diagnosis not present

## 2020-08-07 ENCOUNTER — Ambulatory Visit: Payer: Medicaid Other

## 2020-08-08 ENCOUNTER — Ambulatory Visit (HOSPITAL_COMMUNITY)
Admission: EM | Admit: 2020-08-08 | Discharge: 2020-08-08 | Disposition: A | Discharge status: Home / Self Care | Payer: Medicaid Other | Attending: Internal Medicine | Admitting: Internal Medicine

## 2020-08-08 ENCOUNTER — Other Ambulatory Visit: Payer: Self-pay

## 2020-08-08 ENCOUNTER — Encounter (HOSPITAL_COMMUNITY): Payer: Self-pay

## 2020-08-08 ENCOUNTER — Ambulatory Visit (HOSPITAL_COMMUNITY): Payer: Medicaid Other

## 2020-08-08 DIAGNOSIS — Z3201 Encounter for pregnancy test, result positive: Secondary | ICD-10-CM

## 2020-08-08 LAB — POC URINE PREG, ED: Preg Test, Ur: POSITIVE — AB

## 2020-08-08 NOTE — ED Triage Notes (Signed)
Pt in requesting pregnancy test. Pt states she is experiencing breast tenderness  States her LMP was in February or march and she had a positive home test in may

## 2020-08-09 NOTE — ED Provider Notes (Signed)
[facility]    CSN: [id number] Arrival date & time: 08/08/20  1512      History   Chief Complaint Chief Complaint  Patient presents with   Possible Pregnancy    HPI Holly Bridges is a 30 y.o. female comes to the urgent care for pregnancy test.  Patient has been amenorrheic since February of March of this year.  No vaginal bleeding, abdominal pain or cramping.  She has some tenderness around the nipples.  No vomiting.   HPI  Past Medical History:  Diagnosis Date   Anemia    Hypertension affecting pregnancy 06/20/2019    Patient Active Problem List   Diagnosis Date Noted   Preterm labor in third trimester with preterm delivery 06/23/2019   Breech extraction, fetus 2 06/21/2019   Hypertension in pregnancy, severe preeclampsia, delivered 06/20/2019   Pregnancy affected by fetal growth restriction 05/30/2019   Acute on chronic anemia 05/15/2019   ASCUS with positive high risk HPV cervical 05/12/2019   Postpartum anemia 05/11/2019   Rubella non-immune status, antepartum 05/11/2019   Heart murmur 05/11/2019   Supervision of high risk pregnancy, antepartum 05/10/2019   Tobacco smoking affecting pregnancy in second trimester 05/10/2019   Uses marijuana 05/10/2019   Twin pregnancy, delivered vaginally, current hospitalization 05/10/2019   Late prenatal care 05/10/2019   History of retained placenta 05/10/2019   History of preterm premature rupture of membranes (PPROM) 05/10/2019    Past Surgical History:  Procedure Laterality Date   adnoids     DILATION AND CURETTAGE OF UTERUS     TONSILLECTOMY      OB History     Gravida  5   Para  4   Term  2   Preterm  2   AB      Living  5      SAB      IAB      Ectopic      Multiple  1   Live Births  5            Home Medications    Prior to Admission medications   Medication Sig Start Date End Date Taking? Authorizing Provider  acetaminophen (TYLENOL) 500 MG tablet Take 2 tablets  (1,000 mg total) by mouth every 6 (six) hours as needed for mild pain, moderate pain or headache. 06/23/19   Anyanwu, Sallyanne Havers, MD  amLODipine (NORVASC) 10 MG tablet Take 1 tablet (10 mg total) by mouth daily. 06/24/19   Anyanwu, Sallyanne Havers, MD  Blood Pressure Monitor KIT Please check blood pressure 1-2 weeks. 05/10/19   Nugent, Gerrie Nordmann, NP  cyclobenzaprine (FLEXERIL) 10 MG tablet Take 1 tablet (10 mg total) by mouth 3 (three) times daily as needed for muscle spasms. 06/23/19   Anyanwu, Sallyanne Havers, MD  docusate sodium (COLACE) 100 MG capsule Take 1 capsule (100 mg total) by mouth 2 (two) times daily as needed for mild constipation or moderate constipation. 06/23/19   Anyanwu, Sallyanne Havers, MD  ferrous sulfate 325 (65 FE) MG tablet Take 1 tablet (325 mg total) by mouth daily with breakfast. 06/24/19   Anyanwu, Sallyanne Havers, MD  ibuprofen (ADVIL) 800 MG tablet Take 1 tablet (800 mg total) by mouth 3 (three) times daily with meals as needed for headache, mild pain, moderate pain or cramping. 06/23/19   Anyanwu, Sallyanne Havers, MD  Prenatal Vit-Fe Fumarate-FA (PRENATAL VITAMIN PO) Take by mouth.    [provider]    Family History History reviewed.  No pertinent family history.  Social History Social History   Tobacco Use   Smoking status: Every Day    Packs/day: 1.00    Years: 5.00    Pack years: 5.00    Types: Cigarettes   Smokeless tobacco: Never  Substance Use Topics   Alcohol use: No   Drug use: Yes    Frequency: 1.0 times per week    Types: Marijuana    Comment: daily use     Allergies   Sulfa antibiotics   Review of Systems Review of Systems  As per HPI Physical Exam Triage Vital Signs ED Triage Vitals  Enc Vitals Group     BP 08/08/20 1533 123/72     Pulse Rate 08/08/20 1533 83     Resp 08/08/20 1533 18     Temp 08/08/20 1533 99 F (37.2 C)     Temp Source 08/08/20 1533 Oral     SpO2 08/08/20 1533 98 %     Weight --      Height --      Head Circumference --      Peak Flow --       Pain Score 08/08/20 1531 0     Pain Loc --      Pain Edu? --      Excl. in [place]? --    No data found.  Updated Vital Signs BP 123/72 (BP Location: Left Arm)   Pulse 83   Temp 99 F (37.2 C) (Oral)   Resp 18   LMP 04/03/2020 (Approximate)   SpO2 98%   Visual Acuity Right Eye Distance:   Left Eye Distance:   Bilateral Distance:    Right Eye Near:   Left Eye Near:    Bilateral Near:     Physical Exam Vitals and nursing note reviewed.  Constitutional:      Appearance: Normal appearance.  Cardiovascular:     Rate and Rhythm: Normal rate and regular rhythm.  Pulmonary:     Effort: Pulmonary effort is normal.     Breath sounds: Normal breath sounds.  Abdominal:     General: Abdomen is flat. Bowel sounds are normal.  Neurological:     Mental Status: She is alert.     UC Treatments / Results  Labs (all labs ordered are listed, but only abnormal results are displayed) Labs Reviewed  POC URINE PREG, ED - Abnormal; Notable for the following components:      Result Value   Preg Test, Ur POSITIVE (*)    All other components within normal limits    EKG   Radiology No results found.  Procedures Procedures (including critical care time)  Medications Ordered in UC Medications - No data to display  Initial Impression / Assessment and Plan / UC Course  I have reviewed the triage vital signs and the nursing notes.  Pertinent labs & imaging results that were available during my care of the patient were reviewed by me and considered in my medical decision making (see chart for details).     1.  Positive pregnancy test: Patient is advised to take prenatal vitamins Establish OB/GYN care  Final Clinical Impressions(s) / UC Diagnoses   Final diagnoses:  Positive pregnancy test   Discharge Instructions   None    ED Prescriptions   None    PDMP not reviewed this encounter.   Chase Picket, MD 08/09/20 [phone]

## 2020-08-23 DIAGNOSIS — Z419 Encounter for procedure for purposes other than remedying health state, unspecified: Secondary | ICD-10-CM | POA: Diagnosis not present

## 2020-08-27 DIAGNOSIS — O09899 Supervision of other high risk pregnancies, unspecified trimester: Secondary | ICD-10-CM | POA: Insufficient documentation

## 2020-08-28 ENCOUNTER — Ambulatory Visit (INDEPENDENT_AMBULATORY_CARE_PROVIDER_SITE_OTHER): Payer: Medicaid Other

## 2020-08-28 DIAGNOSIS — Z3480 Encounter for supervision of other normal pregnancy, unspecified trimester: Secondary | ICD-10-CM

## 2020-08-28 DIAGNOSIS — O09899 Supervision of other high risk pregnancies, unspecified trimester: Secondary | ICD-10-CM

## 2020-08-28 DIAGNOSIS — Z3A00 Weeks of gestation of pregnancy not specified: Secondary | ICD-10-CM

## 2020-08-28 MED ORDER — GOJJI WEIGHT SCALE MISC: 1.0000 | 0 refills | Status: DC

## 2020-08-28 MED ORDER — BLOOD PRESSURE KIT DEVI: 1.0000 | 0 refills | Status: DC

## 2020-08-28 NOTE — Progress Notes (Addendum)
New OB Intake  I connected with  Holly Bridges on 08/28/20 at  2:00 PM EDT by telephone Video Visit and verified that I am speaking with the correct person using two identifiers. Nurse is located at [facility] and pt is located at [facility].  I discussed the limitations, risks, security and privacy concerns of performing an evaluation and management service by telephone and the availability of in person appointments. I also discussed with the patient that there may be a patient responsible charge related to this service. The patient expressed understanding and agreed to proceed.  I explained I am completing New OB Intake today. We discussed her EDD of 02/02/2021 that is based on LMP of 04/28/2020. Pt is G5/P4. I reviewed her allergies, medications, Medical/Surgical/OB history, and appropriate screenings. I informed her of [facility] services. Based on history, this is a/an  pregnancy uncomplicated .   Patient Active Problem List   Diagnosis Date Noted   Supervision of other high risk pregnancy, antepartum 08/27/2020   Preterm labor in third trimester with preterm delivery 06/23/2019   Breech extraction, fetus 2 06/21/2019   Hypertension in pregnancy, severe preeclampsia, delivered 06/20/2019   Pregnancy affected by fetal growth restriction 05/30/2019   Acute on chronic anemia 05/15/2019   ASCUS with positive high risk HPV cervical 05/12/2019   Postpartum anemia 05/11/2019   Rubella non-immune status, antepartum 05/11/2019   Heart murmur 05/11/2019   Supervision of high risk pregnancy, antepartum 05/10/2019   Tobacco smoking affecting pregnancy in second trimester 05/10/2019   Uses marijuana 05/10/2019   Twin pregnancy, delivered vaginally, current hospitalization 05/10/2019   Late prenatal care 05/10/2019   History of retained placenta 05/10/2019   History of preterm premature rupture of membranes (PPROM) 05/10/2019    Concerns addressed today  Delivery Plans:  Plans to deliver at [facility].   MyChart/Babyscripts MyChart access verified. I explained pt will have some visits in office and some virtually. Babyscripts instructions given and order placed. Patient verifies receipt of registration text/e-mail. Account successfully created and app downloaded.  Blood Pressure Cuff  Blood pressure cuff ordered for patient to pick-up from [employer]. Explained after first prenatal appt pt will check weekly and document in Babyscripts.  Weight scale: Patient    have weight scale. Weight scale ordered   Anatomy US Explained first scheduled [place] will be around 19 weeks. Anatomy US will be scheduled.  Labs Discussed Avelina Laine genetic screening with patient. Would like both Panorama and Horizon drawn at new OB visit. Routine prenatal labs needed.  Covid Vaccine Patient has not covid vaccine.   Mother/ Baby Dyad Candidate?    If yes, offer as possibility  Inform patient of [facility] and place . In AVS   Social Determinants of Health Food Insecurity: Patient denies food insecurity. WIC Referral: Patient is not interested in referral to [facility].  Transportation: Patient expressed transportation needs. Transportation Services reviewed with patient; patient registered and phone number provided for patient to schedule rides. Childcare: Discussed no children allowed at ultrasound appointments. Offered childcare services; patient declines childcare services at this time.  First visit review I reviewed new OB appt with pt. I explained she will have a pelvic exam, ob bloodwork with genetic screening, and PAP smear. Explained pt will be seen by Dr. Clearance Coots 09/02/2020 at first visit; encounter routed to appropriate provider. Explained that patient will be seen by pregnancy navigator following visit with provider. [facility] information placed in AVS.   Maretta Bees, RMA  08/28/2020  2:13 PM   Patient was assessed and managed by nursing staff during this encounter. I have reviewed the chart  and agree with the documentation and plan. I have also made any necessary editorial changes.  Coral Ceo, MD 08/29/2020 8:48 AM

## 2020-09-02 ENCOUNTER — Encounter: Payer: Medicaid Other | Admitting: Obstetrics

## 2020-09-03 DIAGNOSIS — O900 Disruption of cesarean delivery wound: Secondary | ICD-10-CM | POA: Diagnosis not present

## 2020-09-23 DIAGNOSIS — Z419 Encounter for procedure for purposes other than remedying health state, unspecified: Secondary | ICD-10-CM | POA: Diagnosis not present

## 2020-09-25 ENCOUNTER — Encounter: Payer: Medicaid Other | Admitting: Obstetrics

## 2020-10-22 ENCOUNTER — Encounter: Payer: Self-pay | Admitting: Obstetrics

## 2020-10-22 ENCOUNTER — Other Ambulatory Visit: Payer: Self-pay

## 2020-10-22 ENCOUNTER — Other Ambulatory Visit (HOSPITAL_COMMUNITY)
Admission: RE | Admit: 2020-10-22 | Discharge: 2020-10-22 | Disposition: A | Discharge status: Home / Self Care | Payer: Medicaid Other | Source: Ambulatory Visit | Attending: Obstetrics | Admitting: Obstetrics

## 2020-10-22 ENCOUNTER — Ambulatory Visit (INDEPENDENT_AMBULATORY_CARE_PROVIDER_SITE_OTHER): Payer: Medicaid Other | Admitting: Obstetrics

## 2020-10-22 VITALS — BP 147/89 | HR 101 | Wt 150.0 lb

## 2020-10-22 DIAGNOSIS — R12 Heartburn: Secondary | ICD-10-CM

## 2020-10-22 DIAGNOSIS — Z8759 Personal history of other complications of pregnancy, childbirth and the puerperium: Secondary | ICD-10-CM

## 2020-10-22 DIAGNOSIS — O99019 Anemia complicating pregnancy, unspecified trimester: Secondary | ICD-10-CM

## 2020-10-22 DIAGNOSIS — O0990 Supervision of high risk pregnancy, unspecified, unspecified trimester: Secondary | ICD-10-CM

## 2020-10-22 DIAGNOSIS — O26893 Other specified pregnancy related conditions, third trimester: Secondary | ICD-10-CM

## 2020-10-22 DIAGNOSIS — O0930 Supervision of pregnancy with insufficient antenatal care, unspecified trimester: Secondary | ICD-10-CM

## 2020-10-22 LAB — HEPATITIS C ANTIBODY: HCV Ab: POSITIVE

## 2020-10-22 MED ORDER — DOCUSATE SODIUM 100 MG PO CAPS: 100.0000 mg | ORAL_CAPSULE | Freq: Two times a day (BID) | ORAL | 11 refills | Status: DC

## 2020-10-22 MED ORDER — IRON POLYSACCH CMPLX-B12-FA 150-0.025-1 MG PO CAPS: 1.0000 | ORAL_CAPSULE | Freq: Every day | ORAL | 6 refills | Status: DC

## 2020-10-22 MED ORDER — PANTOPRAZOLE SODIUM 40 MG PO TBEC: 40.0000 mg | DELAYED_RELEASE_TABLET | Freq: Every day | ORAL | 5 refills | Status: DC

## 2020-10-22 MED ORDER — ASPIRIN 81 MG PO CHEW: 81.0000 mg | CHEWABLE_TABLET | Freq: Every day | ORAL | 6 refills | Status: DC

## 2020-10-22 MED ORDER — VITAFOL ULTRA 29-0.6-0.4-200 MG PO CAPS: 1.0000 | ORAL_CAPSULE | Freq: Every day | ORAL | 4 refills | Status: DC

## 2020-10-22 NOTE — Progress Notes (Addendum)
Subjective:    Holly Bridges is being seen today for her first obstetrical visit.  This is not a planned pregnancy. She is at [redacted]w[redacted]d gestation. Her obstetrical history is significant for excessive weight gain, intrauterine growth restriction (IUGR), obesity, pre-eclampsia, smoker, and pprom, preterm delivery of twins at 36 weeks . Relationship with FOB: significant other, living together. Patient does intend to breast feed. Pregnancy history fully reviewed.  The information documented in the HPI was reviewed and verified.  Menstrual History: OB History     Gravida  5   Para  4   Term  2   Preterm  2   AB      Living  4      SAB      IAB      Ectopic      Multiple  1   Live Births  5           Patient's last menstrual period was 04/28/2020.    Past Medical History:  Diagnosis Date   Anemia    Hypertension affecting pregnancy 06/20/2019    Past Surgical History:  Procedure Laterality Date   adnoids     DILATION AND CURETTAGE OF UTERUS     TONSILLECTOMY      (Not in a hospital admission)  Allergies  Allergen Reactions   Sulfa Antibiotics     Social History   Tobacco Use   Smoking status: Every Day    Packs/day: 1.00    Years: 5.00    Pack years: 5.00    Types: Cigarettes   Smokeless tobacco: Never   Tobacco comments:    1/2-1 pack/day  Substance Use Topics   Alcohol use: No    No family history on file.   Review of Systems Constitutional: negative for weight loss Gastrointestinal: positive for heartburn.  negative for nausea / vomiting Genitourinary:negative for genital lesions and vaginal discharge and dysuria Musculoskeletal:negative for back pain Behavioral/Psych: negative for abusive relationship, depression, illegal drug usage and tobacco use    Objective:    BP (!) 147/89   Pulse (!) 101   Wt 150 lb (68 kg)   LMP 04/28/2020   BMI 25.75 kg/m  General Appearance:    Alert, cooperative, no distress, appears stated age  Head:     Normocephalic, without obvious abnormality, atraumatic  Eyes:    PERRL, conjunctiva/corneas clear, EOM's intact, fundi    benign, both eyes  Ears:    Normal TM's and external ear canals, both ears  Nose:   Nares normal, septum midline, mucosa normal, no drainage    or sinus tenderness  Throat:   Lips, mucosa, and tongue normal; teeth and gums normal  Neck:   Supple, symmetrical, trachea midline, no adenopathy;    thyroid:  no enlargement/tenderness/nodules; no carotid   bruit or JVD  Back:     Symmetric, no curvature, ROM normal, no CVA tenderness  Lungs:     Clear to auscultation bilaterally, respirations unlabored  Chest Wall:    No tenderness or deformity   Heart:    Regular rate and rhythm, S1 and S2 normal, no murmur, rub   or gallop  Breast Exam:    No tenderness, masses, or nipple abnormality  Abdomen:     Soft, non-tender, bowel sounds active all four quadrants,    no masses, no organomegaly  Genitalia:    Normal female without lesion, discharge or tenderness  Extremities:   Extremities normal, atraumatic, no cyanosis or edema  Pulses:   2+ and symmetric all extremities  Skin:   Skin color, texture, turgor normal, no rashes or lesions  Lymph nodes:   Cervical, supraclavicular, and axillary nodes normal  Neurologic:   CNII-XII intact, normal strength, sensation and reflexes    throughout      Lab Review Urine pregnancy test Labs reviewed yes Radiologic studies reviewed no  Assessment:    Pregnancy at 102w2d weeks    Plan:   1. Supervision of high risk pregnancy, antepartum Rx: - Cytology - PAP( [facility]) - Cervicovaginal ancillary only( [facility]) - Culture, OB Urine - Obstetric Panel, Including HIV - Hepatitis C antibody - [place] MFM OB DETAIL +14 WK; Future - docusate sodium (COLACE) 100 MG capsule; Take 1 capsule (100 mg total) by mouth 2 (two) times daily.  Dispense: 60 capsule; Refill: 11 - Genetic Screening - ToxASSURE Select 13 (MW), Urine - Prenat-Fe  Poly-Methfol-FA-DHA (VITAFOL ULTRA) 29-0.6-0.4-200 MG CAPS; Take 1 capsule by mouth daily before breakfast.  Dispense: 90 capsule; Refill: 4  2. Late prenatal care  3. H/O severe pre-eclampsia Rx: - aspirin 81 MG chewable tablet; Chew 1 tablet (81 mg total) by mouth daily.  Dispense: 30 tablet; Refill: 6  4. History of preterm premature rupture of membranes (PPROM)  5. Anemia affecting pregnancy, antepartum Rx: - Iron Polysacch Cmplx-B12-FA 150-0.025-1 MG CAPS; Take 1 capsule by mouth daily before breakfast.  Dispense: 30 capsule; Refill: 6  6. Heartburn during pregnancy in third trimester Rx: - pantoprazole (PROTONIX) 40 MG tablet; Take 1 tablet (40 mg total) by mouth daily.  Dispense: 30 tablet; Refill: 5   Prenatal vitamins Counseling provided regarding continued use of seat belts, cessation of alcohol consumption, smoking or use of illicit drugs; infection precautions i.e., influenza/TDAP immunizations, toxoplasmosis,CMV, parvovirus, listeria and varicella; workplace safety, exercise during pregnancy; routine dental care, safe medications, sexual activity, hot tubs, saunas, pools, travel, caffeine use, fish and methlymercury, potential toxins, hair treatments, varicose veins Weight gain recommendations per IOM guidelines reviewed: underweight/BMI< 18.5--> gain 28 - 40 lbs; normal weight/BMI 18.5 - 24.9--> gain 25 - 35 lbs; overweight/BMI 25 - 29.9--> gain 15 - 25 lbs; obese/BMI >30->gain  11 - 20 lbs Problem list reviewed and updated. FIRST/CF mutation testing/NIPT/QUAD SCREEN/fragile X/Ashkenazi Jewish population testing/Spinal muscular atrophy discussed: requested. Role of ultrasound in pregnancy discussed; fetal survey: requested. Amniocentesis discussed: not indicated.  Meds ordered this encounter  Medications   docusate sodium (COLACE) 100 MG capsule    Sig: Take 1 capsule (100 mg total) by mouth 2 (two) times daily.    Dispense:  60 capsule    Refill:  11   Prenat-Fe  Poly-Methfol-FA-DHA (VITAFOL ULTRA) 29-0.6-0.4-200 MG CAPS    Sig: Take 1 capsule by mouth daily before breakfast.    Dispense:  90 capsule    Refill:  4   Iron Polysacch Cmplx-B12-FA 150-0.025-1 MG CAPS    Sig: Take 1 capsule by mouth daily before breakfast.    Dispense:  30 capsule    Refill:  6   pantoprazole (PROTONIX) 40 MG tablet    Sig: Take 1 tablet (40 mg total) by mouth daily.    Dispense:  30 tablet    Refill:  5   aspirin 81 MG chewable tablet    Sig: Chew 1 tablet (81 mg total) by mouth daily.    Dispense:  30 tablet    Refill:  6   Orders Placed This Encounter  Procedures   Culture, OB Urine  [place] MFM OB DETAIL +14 WK    Standing Status:   Future    Standing Expiration Date:   10/22/2021    Order Specific Question:   Reason for Exam (SYMPTOM  OR DIAGNOSIS REQUIRED)    Answer:   anatomy, late to care    Order Specific Question:   Preferred Location    Answer:   [facility]   Obstetric Panel, Including HIV   Hepatitis C antibody   Genetic Screening   ToxASSURE Select 13 (MW), Urine    I have spent a total of 20 minutes of face-to-face time, excluding clinical staff time, reviewing notes and preparing to see patient, ordering tests and/or medications, and counseling the patient.   N Return visit:  Return in 3 weeks  Brock Bad, MD 10/22/2020 10:49 AM

## 2020-10-22 NOTE — Addendum Note (Signed)
Addended by: Coral Ceo A on: 10/22/2020 10:52 AM   Modules accepted: Orders

## 2020-10-23 ENCOUNTER — Encounter: Payer: Self-pay | Admitting: *Deleted

## 2020-10-23 ENCOUNTER — Other Ambulatory Visit: Payer: Self-pay | Admitting: Obstetrics

## 2020-10-23 DIAGNOSIS — N760 Acute vaginitis: Secondary | ICD-10-CM

## 2020-10-23 DIAGNOSIS — A749 Chlamydial infection, unspecified: Secondary | ICD-10-CM

## 2020-10-23 DIAGNOSIS — B9689 Other specified bacterial agents as the cause of diseases classified elsewhere: Secondary | ICD-10-CM

## 2020-10-23 LAB — CERVICOVAGINAL ANCILLARY ONLY
Bacterial Vaginitis (gardnerella): POSITIVE — AB
Candida Glabrata: NEGATIVE
Candida Vaginitis: NEGATIVE
Chlamydia: POSITIVE — AB
Comment: NEGATIVE
Comment: NEGATIVE
Comment: NEGATIVE
Comment: NEGATIVE
Comment: NEGATIVE
Comment: NORMAL
Neisseria Gonorrhea: NEGATIVE
Trichomonas: NEGATIVE

## 2020-10-23 MED ORDER — METRONIDAZOLE 500 MG PO TABS: 500.0000 mg | ORAL_TABLET | Freq: Two times a day (BID) | ORAL | 2 refills | Status: DC

## 2020-10-23 MED ORDER — AZITHROMYCIN 500 MG PO TABS
1000.0000 mg | ORAL_TABLET | Freq: Once | ORAL | 0 refills | Status: AC
Start: 2020-10-23 — End: 2020-10-23

## 2020-10-23 NOTE — Progress Notes (Signed)
Patient is active in MyChart. Message regarding BV and Chlamydia diagnosis sent via MyChart. Patient education on both included.

## 2020-10-24 ENCOUNTER — Encounter: Payer: Self-pay | Admitting: *Deleted

## 2020-10-24 ENCOUNTER — Other Ambulatory Visit: Payer: Self-pay | Admitting: Obstetrics

## 2020-10-24 DIAGNOSIS — Z419 Encounter for procedure for purposes other than remedying health state, unspecified: Secondary | ICD-10-CM | POA: Diagnosis not present

## 2020-10-24 DIAGNOSIS — R768 Other specified abnormal immunological findings in serum: Secondary | ICD-10-CM

## 2020-10-24 LAB — OBSTETRIC PANEL, INCLUDING HIV
Antibody Screen: NEGATIVE
Basophils Absolute: 0.1 10*3/uL (ref 0.0–0.2)
Basos: 0 %
EOS (ABSOLUTE): 0.1 10*3/uL (ref 0.0–0.4)
Eos: 1 %
HIV Screen 4th Generation wRfx: NONREACTIVE
Hematocrit: 28.5 % — ABNORMAL LOW (ref 34.0–46.6)
Hemoglobin: 9.2 g/dL — ABNORMAL LOW (ref 11.1–15.9)
Hepatitis B Surface Ag: NEGATIVE
Immature Grans (Abs): 0.1 10*3/uL (ref 0.0–0.1)
Immature Granulocytes: 1 %
Lymphocytes Absolute: 2.4 10*3/uL (ref 0.7–3.1)
Lymphs: 17 %
MCH: 27 pg (ref 26.6–33.0)
MCHC: 32.3 g/dL (ref 31.5–35.7)
MCV: 84 fL (ref 79–97)
Monocytes Absolute: 0.6 10*3/uL (ref 0.1–0.9)
Monocytes: 4 %
Neutrophils Absolute: 10.8 10*3/uL — ABNORMAL HIGH (ref 1.4–7.0)
Neutrophils: 77 %
Platelets: 294 10*3/uL (ref 150–450)
RBC: 3.41 x10E6/uL — ABNORMAL LOW (ref 3.77–5.28)
RDW: 16 % — ABNORMAL HIGH (ref 11.7–15.4)
RPR Ser Ql: NONREACTIVE
Rh Factor: POSITIVE
Rubella Antibodies, IGG: 1.01 index (ref >0.99)
WBC: 14.1 10*3/uL — ABNORMAL HIGH (ref 3.4–10.8)

## 2020-10-24 LAB — HEPATITIS C ANTIBODY: Hep C Virus Ab: >11 s/co ratio — ABNORMAL HIGH (ref 0.0–0.9)

## 2020-10-24 LAB — CYTOLOGY - PAP
Comment: NEGATIVE
Diagnosis: NEGATIVE
High risk HPV: NEGATIVE

## 2020-10-24 NOTE — Progress Notes (Signed)
Patient is active in MyChart. Message sent regarding referral to infectious disease for Hep C positive and regarding need for Iron due to anemia.

## 2020-10-24 NOTE — Progress Notes (Signed)
MyChart message and education sent.

## 2020-10-25 LAB — URINE CULTURE, OB REFLEX

## 2020-10-25 LAB — CULTURE, OB URINE

## 2020-10-26 ENCOUNTER — Other Ambulatory Visit: Payer: Self-pay | Admitting: Obstetrics

## 2020-10-26 DIAGNOSIS — N3000 Acute cystitis without hematuria: Secondary | ICD-10-CM

## 2020-10-26 LAB — TOXASSURE SELECT 13 (MW), URINE

## 2020-10-26 MED ORDER — CEFUROXIME AXETIL 500 MG PO TABS: 500.0000 mg | ORAL_TABLET | Freq: Two times a day (BID) | ORAL | 0 refills | Status: DC

## 2020-11-01 ENCOUNTER — Encounter: Payer: Self-pay | Admitting: Obstetrics

## 2020-11-04 ENCOUNTER — Encounter: Payer: Medicaid Other | Admitting: Family

## 2020-11-04 ENCOUNTER — Telehealth: Payer: Self-pay

## 2020-11-04 ENCOUNTER — Other Ambulatory Visit (HOSPITAL_COMMUNITY): Payer: Self-pay

## 2020-11-04 NOTE — Telephone Encounter (Signed)
[facility] Patient Advocate Encounter  Insurance verification completed.    The patient is insured through [employer] and has a $4.00 copay.  Medication will need a PA.  We will continue to follow to see if copay assistance is needed.  Vashti Bolanos, CPhT [facility] Patient Advocate [facility] Phone: [phone] Fax:  [phone]  

## 2020-11-08 ENCOUNTER — Other Ambulatory Visit: Payer: Self-pay

## 2020-11-08 ENCOUNTER — Ambulatory Visit: Payer: Medicaid Other

## 2020-11-08 DIAGNOSIS — B3731 Acute candidiasis of vulva and vagina: Secondary | ICD-10-CM

## 2020-11-08 DIAGNOSIS — B373 Candidiasis of vulva and vagina: Secondary | ICD-10-CM

## 2020-11-08 MED ORDER — TERCONAZOLE 0.4 % VA CREA: 1.0000 | TOPICAL_CREAM | Freq: Every day | VAGINAL | 0 refills | Status: DC

## 2020-11-12 ENCOUNTER — Other Ambulatory Visit: Payer: Medicaid Other

## 2020-11-12 ENCOUNTER — Encounter: Payer: Medicaid Other | Admitting: Obstetrics & Gynecology

## 2020-11-13 ENCOUNTER — Ambulatory Visit: Payer: Medicaid Other | Attending: Maternal & Fetal Medicine | Admitting: Maternal & Fetal Medicine

## 2020-11-13 ENCOUNTER — Ambulatory Visit: Payer: Medicaid Other | Admitting: *Deleted

## 2020-11-13 ENCOUNTER — Ambulatory Visit: Payer: Medicaid Other | Attending: Obstetrics

## 2020-11-13 ENCOUNTER — Other Ambulatory Visit: Payer: Self-pay

## 2020-11-13 ENCOUNTER — Encounter: Payer: Self-pay | Admitting: *Deleted

## 2020-11-13 ENCOUNTER — Other Ambulatory Visit: Payer: Self-pay | Admitting: *Deleted

## 2020-11-13 VITALS — BP 133/81 | HR 88

## 2020-11-13 DIAGNOSIS — O0990 Supervision of high risk pregnancy, unspecified, unspecified trimester: Secondary | ICD-10-CM | POA: Insufficient documentation

## 2020-11-13 DIAGNOSIS — O09299 Supervision of pregnancy with other poor reproductive or obstetric history, unspecified trimester: Secondary | ICD-10-CM

## 2020-11-13 DIAGNOSIS — O365930 Maternal care for other known or suspected poor fetal growth, third trimester, not applicable or unspecified: Secondary | ICD-10-CM | POA: Diagnosis not present

## 2020-11-13 DIAGNOSIS — Z8751 Personal history of pre-term labor: Secondary | ICD-10-CM | POA: Diagnosis not present

## 2020-11-13 DIAGNOSIS — O99333 Smoking (tobacco) complicating pregnancy, third trimester: Secondary | ICD-10-CM

## 2020-11-13 DIAGNOSIS — O365990 Maternal care for other known or suspected poor fetal growth, unspecified trimester, not applicable or unspecified: Secondary | ICD-10-CM

## 2020-11-13 DIAGNOSIS — O0933 Supervision of pregnancy with insufficient antenatal care, third trimester: Secondary | ICD-10-CM

## 2020-11-13 DIAGNOSIS — O09899 Supervision of other high risk pregnancies, unspecified trimester: Secondary | ICD-10-CM

## 2020-11-13 NOTE — Progress Notes (Signed)
MFM consultation.  Holly Bridges is a 30 yo G5P4 who is here for a detailed examination.  She is dated by LMP which is approximate and not certain of 02/02/21   She had a low risk NIPS  no horizon performed.   Her history is significant for prior severe preeclampsia with twin she delivered at 33 weeks 5d . She also had a prior PPROM and delivered at 36 weeks, and fetal growth restriction in a prior pregnancy. Given that she was late entry to care she has not started low dose ASA.  A single intrauterine pregnancy here for a detailed anatomy due to late prenatal care Normal anatomy with measurements consistent with today's examination. Her LMP placed her at the 7th%. The UA Dopplers were normal. There is good fetal movement and amniotic fluid volume Suboptimal views of the fetal anatomy were obtained secondary to fetal position.   I discussed today's visit and findings and recommended serial growth exams every 4-6 weeks give her prior history.  I spent 30 minutes with > 50% in face to face consultation.  Novella Olive, MD

## 2020-11-14 ENCOUNTER — Telehealth: Payer: Self-pay

## 2020-11-14 NOTE — Telephone Encounter (Signed)
Spoke with Donae about moving her follow up ultrasound to Thursday 10/20 - patient was ok with [place] moving her appointment.    Patient ask about US obtaining her ultrasound from [facility] - told her the Release for the ultrasound had been faxed to them.

## 2020-11-23 DIAGNOSIS — Z419 Encounter for procedure for purposes other than remedying health state, unspecified: Secondary | ICD-10-CM | POA: Diagnosis not present

## 2020-11-26 ENCOUNTER — Other Ambulatory Visit: Payer: Self-pay

## 2020-11-26 ENCOUNTER — Other Ambulatory Visit: Payer: Medicaid Other

## 2020-11-26 ENCOUNTER — Ambulatory Visit (INDEPENDENT_AMBULATORY_CARE_PROVIDER_SITE_OTHER): Payer: Medicaid Other | Admitting: Obstetrics and Gynecology

## 2020-11-26 VITALS — BP 136/90 | HR 101 | Wt 151.7 lb

## 2020-11-26 DIAGNOSIS — O0990 Supervision of high risk pregnancy, unspecified, unspecified trimester: Secondary | ICD-10-CM

## 2020-11-26 DIAGNOSIS — M549 Dorsalgia, unspecified: Secondary | ICD-10-CM

## 2020-11-26 DIAGNOSIS — Z2839 Other underimmunization status: Secondary | ICD-10-CM

## 2020-11-26 DIAGNOSIS — O09293 Supervision of pregnancy with other poor reproductive or obstetric history, third trimester: Secondary | ICD-10-CM

## 2020-11-26 DIAGNOSIS — O09893 Supervision of other high risk pregnancies, third trimester: Secondary | ICD-10-CM

## 2020-11-26 DIAGNOSIS — Z23 Encounter for immunization: Secondary | ICD-10-CM | POA: Diagnosis not present

## 2020-11-26 DIAGNOSIS — O0930 Supervision of pregnancy with insufficient antenatal care, unspecified trimester: Secondary | ICD-10-CM

## 2020-11-26 DIAGNOSIS — O99333 Smoking (tobacco) complicating pregnancy, third trimester: Secondary | ICD-10-CM

## 2020-11-26 DIAGNOSIS — Z3A29 29 weeks gestation of pregnancy: Secondary | ICD-10-CM | POA: Insufficient documentation

## 2020-11-26 DIAGNOSIS — Z8759 Personal history of other complications of pregnancy, childbirth and the puerperium: Secondary | ICD-10-CM

## 2020-11-26 DIAGNOSIS — O09899 Supervision of other high risk pregnancies, unspecified trimester: Secondary | ICD-10-CM

## 2020-11-26 DIAGNOSIS — O0933 Supervision of pregnancy with insufficient antenatal care, third trimester: Secondary | ICD-10-CM

## 2020-11-26 DIAGNOSIS — O0993 Supervision of high risk pregnancy, unspecified, third trimester: Secondary | ICD-10-CM

## 2020-11-26 DIAGNOSIS — O10913 Unspecified pre-existing hypertension complicating pregnancy, third trimester: Secondary | ICD-10-CM

## 2020-11-26 DIAGNOSIS — O99891 Other specified diseases and conditions complicating pregnancy: Secondary | ICD-10-CM | POA: Insufficient documentation

## 2020-11-26 MED ORDER — LABETALOL HCL 100 MG PO TABS: 100.0000 mg | ORAL_TABLET | Freq: Two times a day (BID) | ORAL | 2 refills | Status: DC

## 2020-11-26 MED ORDER — CYCLOBENZAPRINE HCL 10 MG PO TABS: 10.0000 mg | ORAL_TABLET | Freq: Three times a day (TID) | ORAL | 0 refills | Status: DC | PRN

## 2020-11-26 MED ORDER — ASPIRIN 81 MG PO CHEW: 81.0000 mg | CHEWABLE_TABLET | Freq: Every day | ORAL | 6 refills | Status: DC

## 2020-11-26 NOTE — Progress Notes (Signed)
29.1 wks ROB Missed GTT today, rescheduled thurs. CBC, HIV, RPR TDAP offered and accepted  Initial BP 166/98 P 96 CMET, Protein/ Creat Ratio pended.  ASA recommended by MFM, not taking yet.

## 2020-11-26 NOTE — Progress Notes (Signed)
PRENATAL VISIT NOTE  Subjective:  Holly Bridges is a 30 y.o. [phone] at [redacted]w[redacted]d being seen today for ongoing prenatal care.  She is currently monitored for the following issues for this high-risk pregnancy and has Supervision of high risk pregnancy, antepartum; Tobacco smoking complicating pregnancy, third trimester; Uses marijuana; Twin pregnancy, delivered vaginally, current hospitalization; Late prenatal care; History of retained placenta; History of preterm premature rupture of membranes (PPROM); Postpartum anemia; Rubella non-immune status, antepartum; Heart murmur; ASCUS with positive high risk HPV cervical; Acute on chronic anemia; Pregnancy affected by fetal growth restriction; Hypertension in pregnancy, severe preeclampsia, delivered; Breech extraction, fetus 2; Preterm labor in third trimester with preterm delivery; Supervision of other high risk pregnancy, antepartum; [redacted] weeks gestation of pregnancy; Hx of preeclampsia, prior pregnancy, currently pregnant, third trimester; and Back pain affecting pregnancy in third trimester on their problem list.  Patient doing well with no acute concerns today. She reports no complaints.  Contractions: Not present. Vag. Bleeding: None.  Movement: Present. Denies leaking of fluid.   Initial BP was 166/98, recheck was 136/90.  Review of chart suggests chronic HTN.  No s/sx of preeclampsia.  Pt denies HA, visual changes and RUQ pain  The following portions of the patient's history were reviewed and updated as appropriate: allergies, current medications, past family history, past medical history, past social history, past surgical history and problem list. Problem list updated.  Objective:   Vitals:   11/26/20 1058 11/26/20 1136  BP: (!) 166/98 136/90  Pulse: 96 (!) 101  Weight: 151 lb 11.2 oz (68.8 kg)     Fetal Status: Fetal Heart Rate (bpm): 141 Fundal Height: 27 cm Movement: Present     General:  Alert, oriented and cooperative. Patient is in  no acute distress.  Skin: Skin is warm and dry. No rash noted.   Cardiovascular: Normal heart rate noted  Respiratory: Normal respiratory effort, no problems with respiration noted  Abdomen: Soft, gravid, appropriate for gestational age.  Pain/Pressure: Absent     Pelvic: Cervical exam deferred        Extremities: Normal range of motion.  Edema: None  Mental Status:  Normal mood and affect. Normal behavior. Normal judgment and thought content.   Assessment and Plan:  Pregnancy: [id number] at [redacted]w[redacted]d  1. [redacted] weeks gestation of pregnancy   2. Supervision of high risk pregnancy, antepartum Continue routine care, pt will return for 2 hour GTT - CBC - RPR - HIV Antibody (routine testing w rflx) - Tdap vaccine greater than or equal to 7yo IM  3. Late prenatal care   4. History of retained placenta   5. History of preterm premature rupture of membranes (PPROM)   6. Rubella non-immune status, antepartum Treat after delivery  7. Hx of preeclampsia, prior pregnancy, currently pregnant, third trimester Will check labs due to one time severe range pressure, pt likely chronic HTN, labetalol added to medications - CBC - Comprehensive metabolic panel - Protein / creatinine ratio, urine - aspirin 81 MG chewable tablet; Chew 1 tablet (81 mg total) by mouth daily.  Dispense: 30 tablet; Refill: 6  8. H/O severe pre-eclampsia  - aspirin 81 MG chewable tablet; Chew 1 tablet (81 mg total) by mouth daily.  Dispense: 30 tablet; Refill: 6  9. Supervision of other high risk pregnancy, antepartum   10. Back pain affecting pregnancy in third trimester  - cyclobenzaprine (FLEXERIL) 10 MG tablet; Take 1 tablet (10 mg total) by mouth 3 (three) times daily as needed  for muscle spasms.  Dispense: 30 tablet; Refill: 0  11. Tobacco smoking complicating pregnancy, third trimester Pt not ready to quit, currently smokes 0.5-1 PPD, MD asks for compromise ie 0.25 ppd or less  Preterm labor symptoms and  general obstetric precautions including but not limited to vaginal bleeding, contractions, leaking of fluid and fetal movement were reviewed in detail with the patient.  Please refer to After Visit Summary for other counseling recommendations.   Return in about 2 weeks (around 12/10/2020) for [facility], in person.   Mariel Aloe, MD Faculty Attending Center for [facility]

## 2020-11-27 LAB — COMPREHENSIVE METABOLIC PANEL
ALT: 13 IU/L (ref 0–32)
AST: 15 IU/L (ref 0–40)
Albumin/Globulin Ratio: 1.3 (ref 1.2–2.2)
Albumin: 3.8 g/dL (ref 3.8–4.8)
Alkaline Phosphatase: 100 IU/L (ref 44–121)
BUN/Creatinine Ratio: 15 (ref 9–23)
BUN: 11 mg/dL (ref 6–20)
Bilirubin Total: <0.2 mg/dL (ref 0.0–1.2)
CO2: 20 mmol/L (ref 20–29)
Calcium: 8.3 mg/dL — ABNORMAL LOW (ref 8.7–10.2)
Chloride: 102 mmol/L (ref 96–106)
Creatinine, Ser: 0.75 mg/dL (ref 0.57–1.00)
Globulin, Total: 3 g/dL (ref 1.5–4.5)
Glucose: 75 mg/dL (ref 70–99)
Potassium: 3.3 mmol/L — ABNORMAL LOW (ref 3.5–5.2)
Sodium: 136 mmol/L (ref 134–144)
Total Protein: 6.8 g/dL (ref 6.0–8.5)
eGFR: 108 mL/min/{1.73_m2} (ref >59)

## 2020-11-27 LAB — CBC
Hematocrit: 29 % — ABNORMAL LOW (ref 34.0–46.6)
Hemoglobin: 9.9 g/dL — ABNORMAL LOW (ref 11.1–15.9)
MCH: 29.5 pg (ref 26.6–33.0)
MCHC: 34.1 g/dL (ref 31.5–35.7)
MCV: 86 fL (ref 79–97)
Platelets: 227 10*3/uL (ref 150–450)
RBC: 3.36 x10E6/uL — ABNORMAL LOW (ref 3.77–5.28)
RDW: 14.2 % (ref 11.7–15.4)
WBC: 7.3 10*3/uL (ref 3.4–10.8)

## 2020-11-27 LAB — PROTEIN / CREATININE RATIO, URINE
Creatinine, Urine: 196.7 mg/dL
Protein, Ur: 51.4 mg/dL
Protein/Creat Ratio: 261 mg/g creat — ABNORMAL HIGH (ref 0–200)

## 2020-11-27 LAB — RPR: RPR Ser Ql: NONREACTIVE

## 2020-11-27 LAB — HIV ANTIBODY (ROUTINE TESTING W REFLEX): HIV Screen 4th Generation wRfx: NONREACTIVE

## 2020-12-01 ENCOUNTER — Other Ambulatory Visit: Payer: Self-pay

## 2020-12-01 ENCOUNTER — Encounter (HOSPITAL_COMMUNITY): Payer: Self-pay | Admitting: Emergency Medicine

## 2020-12-01 ENCOUNTER — Emergency Department (HOSPITAL_COMMUNITY)
Admission: EM | Admit: 2020-12-01 | Discharge: 2020-12-01 | Discharge status: Against Medical Advice | Payer: Medicaid Other | Attending: Emergency Medicine | Admitting: Emergency Medicine

## 2020-12-01 DIAGNOSIS — T402X1A Poisoning by other opioids, accidental (unintentional), initial encounter: Secondary | ICD-10-CM

## 2020-12-01 DIAGNOSIS — T50904A Poisoning by unspecified drugs, medicaments and biological substances, undetermined, initial encounter: Secondary | ICD-10-CM | POA: Diagnosis not present

## 2020-12-01 DIAGNOSIS — I10 Essential (primary) hypertension: Secondary | ICD-10-CM | POA: Diagnosis not present

## 2020-12-01 DIAGNOSIS — I499 Cardiac arrhythmia, unspecified: Secondary | ICD-10-CM | POA: Diagnosis not present

## 2020-12-01 DIAGNOSIS — F17210 Nicotine dependence, cigarettes, uncomplicated: Secondary | ICD-10-CM | POA: Insufficient documentation

## 2020-12-01 DIAGNOSIS — Z743 Need for continuous supervision: Secondary | ICD-10-CM | POA: Diagnosis not present

## 2020-12-01 DIAGNOSIS — Z79899 Other long term (current) drug therapy: Secondary | ICD-10-CM | POA: Diagnosis not present

## 2020-12-01 DIAGNOSIS — Z7982 Long term (current) use of aspirin: Secondary | ICD-10-CM | POA: Insufficient documentation

## 2020-12-01 DIAGNOSIS — T40604A Poisoning by unspecified narcotics, undetermined, initial encounter: Secondary | ICD-10-CM | POA: Diagnosis not present

## 2020-12-01 DIAGNOSIS — F1110 Opioid abuse, uncomplicated: Secondary | ICD-10-CM | POA: Diagnosis not present

## 2020-12-01 DIAGNOSIS — F1410 Cocaine abuse, uncomplicated: Secondary | ICD-10-CM | POA: Diagnosis present

## 2020-12-01 DIAGNOSIS — R6889 Other general symptoms and signs: Secondary | ICD-10-CM | POA: Diagnosis not present

## 2020-12-01 MED ORDER — SODIUM CHLORIDE 0.9 % IV SOLN: 250.0000 mL | INTRAVENOUS | Status: DC | PRN

## 2020-12-01 MED ORDER — DOCUSATE SODIUM 100 MG PO CAPS: 100.0000 mg | ORAL_CAPSULE | Freq: Every day | ORAL | Status: DC

## 2020-12-01 MED ORDER — CALCIUM CARBONATE ANTACID 500 MG PO CHEW: 2.0000 | CHEWABLE_TABLET | ORAL | Status: DC | PRN

## 2020-12-01 MED ORDER — ACETAMINOPHEN 325 MG PO TABS: 650.0000 mg | ORAL_TABLET | ORAL | Status: DC | PRN

## 2020-12-01 MED ORDER — SODIUM CHLORIDE 0.9% FLUSH: 3.0000 mL | INTRAVENOUS | Status: DC | PRN

## 2020-12-01 MED ORDER — ZOLPIDEM TARTRATE 5 MG PO TABS: 5.0000 mg | ORAL_TABLET | Freq: Every evening | ORAL | Status: DC | PRN

## 2020-12-01 MED ORDER — PRENATAL MULTIVITAMIN CH: 1.0000 | ORAL_TABLET | Freq: Every day | ORAL | Status: DC

## 2020-12-01 MED ORDER — SODIUM CHLORIDE 0.9% FLUSH: 3.0000 mL | Freq: Two times a day (BID) | INTRAVENOUS | Status: DC

## 2020-12-01 NOTE — ED Notes (Signed)
Gave report to Selena Batten, Charity fundraiser. Went into room to transport patient and patient not present. Pt not in surrounding bathrooms or other areas. [employer] and security notified.

## 2020-12-01 NOTE — ED Notes (Signed)
Pt ambulated to bathroom and back to room without assistance

## 2020-12-01 NOTE — ED Notes (Signed)
OB rapid response RN notified ED RN that patient is cleared from their perspective .

## 2020-12-01 NOTE — H&P (Signed)
FACULTY PRACTICE ANTEPARTUM HISTORY AND PHYSICAL NOTE  Assessment and Plan Accidental opioid overdose - she is s/p narcan in route by ambulance. Await ED completion of evaluation to develop plan for the patient given likely cocaine use as well (although this is unclear due to her reaction being more c/w opioid).   H/o IUGR - normal growth this pregnancy by [place] on 9/31  - Next [place] is scheduled for 10/20 for follow up  Tobacco abuse  - She is precontemplative. Cessation was discussed at her last visit.  - Offer patch while inpatient  H/o PreE  - She was started recently on ldASA. Her P/C ratio was 261 on 10/4 and CMET was normal.   H/o HCV s/p treatment  - Antibody was positive but no RNA was run as reflex.  - She will need HCV RNA to ensure clearance and due to prior history  RNI  - MMR postpartum  Presumed cHTN   - started on labetalol 10/4 - Blood pressures here are normal  Anemia  - last check was 5 days ago and HgB was 9.9 - Check Iron studies/B12/folate  History of Present Illness: Holly Bridges is a 30 y.o. [id number] at 24w6din the emergency department for accidental opioid overdose - she was found unresponsive at a gas station. The ambulance gave her narcan and she became responsive. She has a history of OUD and had not used opioids for 4 years. She did purchase cocaine and took that - she thinks there was an opioid in that.  She notes intermittent marijuana use in the pregnancy, usually every couple weeks. She notes tobacco abuse. She had a history of hepatitis C but was treated.  She did not have the OUD during her pregnancies.  She denies any history of cocaine use during this pregnancy until this time.   She denies any abdominal pain or bleeding.    Patient reports the fetal movement as active. Patient reports uterine contraction  activity as none. Patient reports  vaginal bleeding as none. Patient describes fluid per vagina as None. Fetal presentation is  unsure.  Her main pregnancy problems: H/o IUGR - normal growth this pregnancy by [place] 9[place]31  Tobacco abuse - she is precontemplative. Cessation was discussed at her last visit.  H/o PreE - She was started recently on ldASA. Her P/C ratio was 261 on 10/4 and CMET was normal.  H/o HCV s/p treatment - Antibody was positive but no RNA was run as reflex.  RNI - MMR postpartum Presumed cHTN  - started on labetalol 10/4 Anemia - last check was 5 days ago  Patient Active Problem List   Diagnosis Date Noted   [redacted] weeks gestation of pregnancy 11/26/2020   Hx of preeclampsia, prior pregnancy, currently pregnant, third trimester 11/26/2020   Back pain affecting pregnancy in third trimester 11/26/2020   Supervision of other high risk pregnancy, antepartum 08/27/2020   Preterm labor in third trimester with preterm delivery 06/23/2019   Breech extraction, fetus 2 06/21/2019   Hypertension in pregnancy, severe preeclampsia, delivered 06/20/2019   Pregnancy affected by fetal growth restriction 05/30/2019   Acute on chronic anemia 05/15/2019   ASCUS with positive high risk HPV cervical 05/12/2019   Postpartum anemia 05/11/2019   Rubella non-immune status, antepartum 05/11/2019   Heart murmur 05/11/2019   Supervision of high risk pregnancy, antepartum 05/10/2019   Tobacco smoking complicating pregnancy, third trimester 05/10/2019   Uses marijuana 05/10/2019   Twin pregnancy, delivered vaginally, current hospitalization 05/10/2019  Late prenatal care 05/10/2019   History of retained placenta 05/10/2019   History of preterm premature rupture of membranes (PPROM) 05/10/2019    Past Medical History:  Diagnosis Date   Anemia    Hypertension affecting pregnancy 06/20/2019    Past Surgical History:  Procedure Laterality Date   adnoids     DILATION AND CURETTAGE OF UTERUS     TONSILLECTOMY      OB History  Gravida Para Term Preterm AB Living  5 4 2 2   4   SAB IAB Ectopic Multiple Live Births         1 5    # Outcome Date GA Lbr Len/2nd Weight Sex Delivery Anes PTL Lv  5 Current           4A Preterm 06/21/19 69w5d09:31 / 00:05 1930 g M Vag-Spont EPI  LIV     Birth Comments: wnl  4B Preterm 06/21/19 331w5d9:31 / 00:10 1440 g M Vag-Breech EPI  LIV     Birth Comments: wnl  3 Preterm 08/08/16   2268 g F   Y LIV  2 Term 04/05/13   2722 g F   N LIV  1 Term 10/25/05    F   Y LIV    Social History   Socioeconomic History   Marital status: Single    Spouse name: Not on file   Number of children: Not on file   Years of education: Not on file   Highest education level: Not on file  Occupational History   Not on file  Tobacco Use   Smoking status: Every Day    Packs/day: 1.00    Years: 5.00    Pack years: 5.00    Types: Cigarettes   Smokeless tobacco: Never   Tobacco comments:    1/2-1 pack/day  Vaping Use   Vaping Use: Never used  Substance and Sexual Activity   Alcohol use: No   Drug use: Yes    Frequency: 1.0 times per week    Types: Marijuana    Comment: daily use   Sexual activity: Yes  Other Topics Concern   Not on file  Social History Narrative   Not on file   Social Determinants of Health   Financial Resource Strain: Not on file  Food Insecurity: Not on file  Transportation Needs: Not on file  Physical Activity: Not on file  Stress: Not on file  Social Connections: Not on file    Family History  Problem Relation Age of Onset   Cancer Maternal Grandfather     Allergies  Allergen Reactions   Sulfa Antibiotics    Review of Systems - Negative except runny nose.   Vitals:  BP 134/80 (BP Location: Right Arm)   Pulse 94   Temp 97.8 F (36.6 C) (Temporal)   Resp 20   Ht 5' 5"  (1.651 m)   Wt 80 kg   LMP 04/28/2020   SpO2 99%   BMI 29.35 kg/m  Physical Examination: CONSTITUTIONAL: Well-developed, well-nourished female in no acute distress.  HENT:  Normocephalic, atraumatic, External right and left ear normal. Oropharynx is clear and  moist EYES: Conjunctivae and EOM are normal. Pupils are equal, round, and reactive to light. No scleral icterus.  NECK: Normal range of motion, supple, no masses SKIN: Skin is warm and dry. No rash noted. Not diaphoretic. No erythema. No pallor. NEUROLOGIC: Alert and oriented to person, place, and time. Normal reflexes, muscle tone coordination. No cranial nerve deficit  noted. PSYCHIATRIC: Normal mood and affect. Normal behavior. Normal judgment and thought content. CARDIOVASCULAR: Normal heart rate noted, regular rhythm RESPIRATORY: Effort and breath sounds normal, no problems with respiration noted ABDOMEN: Soft, nontender, nondistended, gravid. MUSCULOSKELETAL: Normal range of motion. No edema and no tenderness. 2+ distal pulses.  Cervix: not examined  Fetal Monitoring: 140s, min-mod variability, no decels, appropriate for gestational age 12: Flat  Labs:  No results found for this or any previous visit (from the past 24 hour(s)).  Imaging Studies: [place] MFM OB DETAIL +14 WK  Result Date: 11/13/2020 ----------------------------------------------------------------------  OBSTETRICS REPORT                       (Signed Final 11/13/2020 03:01 pm) ---------------------------------------------------------------------- Patient Info  ID #:       [id number]                          D.O.B.:  Apr 06, 1990 (30 yrs)  Name:       Holly Bridges               Visit Date: 11/13/2020 08:27 am ---------------------------------------------------------------------- Performed By  Attending:        Sander Nephew      Ref. Address:     [address], [address]                                                             [place], [place]  Performed By:     Lelan Pons RDMS       Location[facility]         [facility]                                                             [facility]  Referred By:      Shelly Bombard MD ---------------------------------------------------------------------- Orders  #  Description                           Code        Ordered By  1  [place] MFM OB DETAIL +14 WK               76811.01    Baltazar Najjar ----------------------------------------------------------------------  #  Order #                     Accession #                Episode #  1  [id number]                   [id number]                 [id number] ---------------------------------------------------------------------- Indications  Poor obstetric history: Previous               O09.299  preeclampsia / eclampsia/gestational HTN  Insufficient Prenatal Care                     O09.30  LR NIPS  Poor obstetric history: Previous preterm       O09.219  delivery, antepartum  Poor obstetric history: Previous fetal growth  O09.299  restriction (FGR)  Tobacco use complicating pregnancy, third      O99.333  trimester  [redacted] weeks gestation of pregnancy                Z3A.27 ---------------------------------------------------------------------- Fetal Evaluation  Num Of Fetuses:         1  Fetal Heart Rate(bpm):  132  Cardiac Activity:       Observed  Presentation:           Breech  Placenta:               Posterior  P. Cord Insertion:      Visualized, central  Amniotic Fluid  AFI FV:      Within normal limits  AFI Sum(cm)     %Tile       Largest Pocket(cm)  13.9            44          4.  RUQ(cm)       RLQ(cm)       LUQ(cm)        LLQ(cm)  4             3.3           2.7            3.9 ---------------------------------------------------------------------- Biometry  BPD:      68.7  mm     G. Age:  27w 4d         50  %    CI:        73.25   %    70 - 86  FL/HC:      19.0   %    18.6 - 20.4   HC:      255.1  mm     G. Age:  27w 5d         34  %    HC/AC:      1.09        1.05 - 1.21  AC:      233.6  mm     G. Age:  27w 5d         55  %    FL/BPD:     70.5   %    71 - 87  FL:       48.4  mm     G. Age:  26w 2d         11  %    FL/AC:      20.7   %    20 - 24  HUM:      44.3  mm     G. Age:  26w 2d         24  %  CER:      31.7  mm     G. Age:  27w 2d         64  %  LV:        2.6  mm  CM:        6.9  mm  Est. FW:    1040  gm      2 lb 5 oz     34  % ---------------------------------------------------------------------- OB History  Gravidity:    5         Term:   2        Prem:   2  Living:       4 ---------------------------------------------------------------------- Gestational Age  LMP:           28w 3d        Date:  04/28/20                 EDD:   02/02/21  U/S Today:     27w 2d                                        EDD:   02/10/21  Best:          27w 2d     Det. By:  U/S (11/13/20)           EDD:   02/10/21 ---------------------------------------------------------------------- Anatomy  Cranium:               Appears normal         Aortic Arch:            Appears normal  Cavum:                 Appears normal         Ductal Arch:            Appears normal  Ventricles:            Appears normal         Diaphragm:              Appears normal  Choroid Plexus:        Appears normal         Stomach:  Appears normal, left                                                                        sided  Cerebellum:            Appears normal         Abdomen:                Appears normal  Posterior Fossa:       Appears normal         Abdominal Wall:         Appears nml (cord                                                                        insert, abd wall)  Nuchal Fold:           Not applicable (>23    Cord Vessels:           Appears normal ([redacted]                         wks GA)                                        vessel cord)  Face:                  Appears normal         Kidneys:                 Appear normal                         (orbits and profile)  Lips:                  Appears normal         Bladder:                Appears normal  Thoracic:              Appears normal         Spine:                  Appears normal  Heart:                 Appears normal         Upper Extremities:      Appears normal                         (4CH, axis, and                         situs)  RVOT:                  Appears normal  Lower Extremities:      Appears normal  LVOT:                  Appears normal  Other:  Heels and 5th digit visualized. VC, 3VV and 3VTV visualized. Fetus          appears to be a female. ---------------------------------------------------------------------- Doppler - Fetal Vessels  Umbilical Artery   S/D     %tile      RI    %tile      PI    %tile            ADFV    RDFV   2.65       24    0.62       26    0.98       41               No      No ---------------------------------------------------------------------- Cervix Uterus Adnexa  Cervix  Not visualized (advanced GA >24wks)  Right Ovary  Visualized.  Left Ovary  Visualized. ---------------------------------------------------------------------- Comments  Holly Bridges is a 30 yo G5P4 who is here for a detailed  examination.  She is dated by LMP which is approximate and not certain of  02/63/78  She had a low risk NIPS  no horizon performed.  Her history is significant for prior severe preeclampsia with  twin she delivered at 72 weeks 5d .  She also had a prior PPROM and delivered at 36 weeks, and  fetal growth restriction in a prior pregnancy.  Given that she was late entry to care she has not started low  dose ASA.  A single intrauterine pregnancy here for a detailed anatomy  due to late prenatal care  Normal anatomy with measurements consistent with today's  examination. Her LMP placed her at the 7th%. The UA  Dopplers were normal.  There is good fetal movement and amniotic fluid volume  Suboptimal views of the fetal anatomy were  obtained  secondary to fetal position.   I discussed today's visit and findings and recommended  serial growth exams every 4-6 weeks give her prior history.  I spent 30 minutes with > 50% in face to face consultation.  Vikki Ports, MD. ----------------------------------------------------------------------               Sander Nephew, MD Electronically Signed Final Report   11/13/2020 03:01 pm ----------------------------------------------------------------------    Radene Gunning, MD, [facility] Attending [facility], [facility]

## 2020-12-01 NOTE — ED Provider Notes (Addendum)
[facility] EMERGENCY DEPARTMENT Provider Note   CSN: [id number] Arrival date & time: 12/01/20  0448     History Chief Complaint  Patient presents with   Overdose ( Received Narcan ) 29 wks Preg.    Holly Bridges is a 30 y.o. female.  Patient brought to the emergency department by EMS.  Patient was found in a local gas station unresponsive, seem to have overdosed.  Patient received 6 mg of Narcan intranasal by first responders.  Patient became awake, alert, vomited 1 time.  Patient states that she does not really recall what happened but thinks that she injected heroin tonight.      Past Medical History:  Diagnosis Date   Anemia    Hypertension affecting pregnancy 06/20/2019    Patient Active Problem List   Diagnosis Date Noted   [redacted] weeks gestation of pregnancy 11/26/2020   Hx of preeclampsia, prior pregnancy, currently pregnant, third trimester 11/26/2020   Back pain affecting pregnancy in third trimester 11/26/2020   Supervision of other high risk pregnancy, antepartum 08/27/2020   Preterm labor in third trimester with preterm delivery 06/23/2019   Breech extraction, fetus 2 06/21/2019   Hypertension in pregnancy, severe preeclampsia, delivered 06/20/2019   Pregnancy affected by fetal growth restriction 05/30/2019   Acute on chronic anemia 05/15/2019   ASCUS with positive high risk HPV cervical 05/12/2019   Postpartum anemia 05/11/2019   Rubella non-immune status, antepartum 05/11/2019   Heart murmur 05/11/2019   Supervision of high risk pregnancy, antepartum 05/10/2019   Tobacco smoking complicating pregnancy, third trimester 05/10/2019   Uses marijuana 05/10/2019   Twin pregnancy, delivered vaginally, current hospitalization 05/10/2019   Late prenatal care 05/10/2019   History of retained placenta 05/10/2019   History of preterm premature rupture of membranes (PPROM) 05/10/2019    Past Surgical History:  Procedure Laterality Date   adnoids      DILATION AND CURETTAGE OF UTERUS     TONSILLECTOMY       OB History     Gravida  5   Para  4   Term  2   Preterm  2   AB      Living  4      SAB      IAB      Ectopic      Multiple  1   Live Births  5           Family History  Problem Relation Age of Onset   Cancer Maternal Grandfather     Social History   Tobacco Use   Smoking status: Every Day    Packs/day: 1.00    Years: 5.00    Pack years: 5.00    Types: Cigarettes   Smokeless tobacco: Never   Tobacco comments:    1/2-1 pack/day  Vaping Use   Vaping Use: Never used  Substance Use Topics   Alcohol use: No   Drug use: Yes    Frequency: 1.0 times per week    Types: Marijuana    Comment: daily use    Home Medications Prior to Admission medications   Medication Sig Start Date End Date Taking? Authorizing Provider  acetaminophen (TYLENOL) 500 MG tablet Take 2 tablets (1,000 mg total) by mouth every 6 (six) hours as needed for mild pain, moderate pain or headache. Patient not taking: No sig reported 06/23/19   Anyanwu, Sallyanne Havers, MD  aspirin 81 MG chewable tablet Chew 1 tablet (81 mg total) by  mouth daily. 11/26/20   Griffin Basil, MD  Blood Pressure Monitor KIT Please check blood pressure 1-2 weeks. 05/10/19   Nugent, Gerrie Nordmann, NP  Blood Pressure Monitoring (BLOOD PRESSURE KIT) DEVI 1 kit by Does not apply route once a week. Check Blood Pressure regularly and record readings into the [employer] App.  Large Cuff.  DX O90.0 08/28/20   Shelly Bombard, MD  cyclobenzaprine (FLEXERIL) 10 MG tablet Take 1 tablet (10 mg total) by mouth 3 (three) times daily as needed for muscle spasms. 11/26/20   Griffin Basil, MD  docusate sodium (COLACE) 100 MG capsule Take 1 capsule (100 mg total) by mouth 2 (two) times daily. Patient not taking: Reported on 11/26/2020 10/22/20   Shelly Bombard, MD  Iron Polysacch Cmplx-B12-FA 150-0.025-1 MG CAPS Take 1 capsule by mouth daily before breakfast. Patient not  taking: Reported on 11/26/2020 10/22/20   Shelly Bombard, MD  labetalol (NORMODYNE) 100 MG tablet Take 1 tablet (100 mg total) by mouth 2 (two) times daily. 11/26/20   Griffin Basil, MD  Misc. Devices (GOJJI WEIGHT SCALE) MISC 1 Device by Does not apply route once a week. 08/28/20   Shelly Bombard, MD  pantoprazole (PROTONIX) 40 MG tablet Take 1 tablet (40 mg total) by mouth daily. 10/22/20   Shelly Bombard, MD  Prenatal Vit-Fe Fumarate-FA (PRENATAL MULTIVITAMIN) TABS tablet Take 1 tablet by mouth daily at 12 noon.    [provider]    Allergies    Sulfa antibiotics  Review of Systems   Review of Systems  Neurological:  Positive for syncope.  All other systems reviewed and are negative.  Physical Exam Updated Vital Signs BP 129/88 (BP Location: Right Arm)   Pulse 95   Temp 97.8 F (36.6 C) (Temporal)   Resp 18   Ht $R'5\' 5"'ag$  (1.651 m)   Wt 80 kg   LMP 04/28/2020   SpO2 98%   BMI 29.35 kg/m   Physical Exam Vitals and nursing note reviewed.  Constitutional:      General: She is not in acute distress.    Appearance: Normal appearance. She is well-developed.  HENT:     Head: Normocephalic and atraumatic.     Right Ear: Hearing normal.     Left Ear: Hearing normal.     Nose: Nose normal.  Eyes:     Conjunctiva/sclera: Conjunctivae normal.     Pupils: Pupils are equal, round, and reactive to light.  Cardiovascular:     Rate and Rhythm: Regular rhythm.     Heart sounds: S1 normal and S2 normal. No murmur heard.   No friction rub. No gallop.  Pulmonary:     Effort: Pulmonary effort is normal. No respiratory distress.     Breath sounds: Normal breath sounds.  Chest:     Chest wall: No tenderness.  Abdominal:     General: Bowel sounds are normal.     Palpations: Abdomen is soft.     Tenderness: There is no abdominal tenderness. There is no guarding or rebound. Negative signs include Murphy's sign and McBurney's sign.     Hernia: No hernia is present.   Musculoskeletal:        General: Normal range of motion.     Cervical back: Normal range of motion and neck supple.  Skin:    General: Skin is warm and dry.     Findings: No rash.  Neurological:     Mental Status: She is alert  and oriented to person, place, and time.     GCS: GCS eye subscore is 4. GCS verbal subscore is 5. GCS motor subscore is 6.     Cranial Nerves: No cranial nerve deficit.     Sensory: No sensory deficit.     Coordination: Coordination normal.  Psychiatric:        Speech: Speech normal.        Behavior: Behavior normal.        Thought Content: Thought content normal.    ED Results / Procedures / Treatments   Labs (all labs ordered are listed, but only abnormal results are displayed) Labs Reviewed - No data to display  EKG EKG Interpretation  Date/Time:  Sunday December 01 2020 04:49:45 EDT Ventricular Rate:  94 PR Interval:  150 QRS Duration: 94 QT Interval:  361 QTC Calculation: 452 R Axis:   72 Text Interpretation: Sinus rhythm Normal ECG Confirmed by Orpah Greek [phone]) on 12/01/2020 5:04:42 AM  Radiology No results found.  Procedures Procedures   Medications Ordered in ED Medications - No data to display  ED Course  I have reviewed the triage vital signs and the nursing notes.  Pertinent labs & imaging results that were available during my care of the patient were reviewed by me and considered in my medical decision making (see chart for details).    MDM Rules/Calculators/A&P                           Patient awake, alert, oriented at arrival.  Patient reports she does have a history of polysubstance drug abuse but has been clean for 4 years.  She thinks she injected heroin tonight.  She was found unresponsive at a gas station.  She responded to Narcan.  Patient is [redacted] weeks pregnant.  She reports that she has been receiving prenatal care.  OB rapid response is evaluating the patient's.  Will be monitored for recurrent  sedation secondary to opioid overdose.  This was an accidental overdose, does not require psychiatric involvement.   Final Clinical Impression(s) / ED Diagnoses Final diagnoses:  Opioid overdose, accidental or unintentional, initial encounter [facility])    Rx / DC Orders ED Discharge Orders     None        Gaudencio Chesnut, Gwenyth Allegra, MD 12/01/20 0503    Orpah Greek, MD 12/01/20 0505

## 2020-12-01 NOTE — ED Notes (Addendum)
Patient being evaluated by OB/Gyne MD at this time with rapid response RN , fetal heart rate = 148/min . No contractions , no vaginal bleeding .

## 2020-12-01 NOTE — ED Triage Notes (Addendum)
Patient arrived with EMS from a local gas station bathroom unresponsive , received Narcan 6 mg intranasal prior to arrival , alert and oriented at arrival , emesis x1 prior to arrival , she is [redacted] weeks pregnant G6P5 . Placed on a fetal monitor by rapid response RN at arrival .  Patient stated that she injected Heroin .

## 2020-12-01 NOTE — ED Provider Notes (Signed)
   MDM    61F, [id number] at [redacted]w[redacted]d here with likely heroin overdose. Continuing under obs. Seen by [facility] for [facility], OB requesting to be paged for updates once medically cleared.  8:30 AM Patient now ambulatory in the ED, no longer intoxicated.  OB attending paged.  Plan for transport to the MAU.  9 AM Signout given to the MAU APP.  9:35 AM Informed by nursing staff that patient eloped from the emergency department with IV in place prior to transport to the MAU.  Security paged.         Ernie Avena, MD 12/01/20 [phone]

## 2020-12-05 ENCOUNTER — Other Ambulatory Visit: Payer: Medicaid Other

## 2020-12-10 ENCOUNTER — Telehealth (INDEPENDENT_AMBULATORY_CARE_PROVIDER_SITE_OTHER): Payer: Medicaid Other | Admitting: Obstetrics and Gynecology

## 2020-12-10 VITALS — BP 137/84 | HR 92

## 2020-12-10 DIAGNOSIS — O09293 Supervision of pregnancy with other poor reproductive or obstetric history, third trimester: Secondary | ICD-10-CM

## 2020-12-10 DIAGNOSIS — Z2839 Other underimmunization status: Secondary | ICD-10-CM

## 2020-12-10 DIAGNOSIS — O09899 Supervision of other high risk pregnancies, unspecified trimester: Secondary | ICD-10-CM

## 2020-12-10 DIAGNOSIS — Z3A31 31 weeks gestation of pregnancy: Secondary | ICD-10-CM | POA: Insufficient documentation

## 2020-12-10 DIAGNOSIS — Z8759 Personal history of other complications of pregnancy, childbirth and the puerperium: Secondary | ICD-10-CM

## 2020-12-10 NOTE — Patient Instructions (Signed)
For colds and allergies  Any anti-histamine including benadryl, allegra, claritin, etc.  Sudafed but not phenylephrine  Mucinex  Robitussin  For Reflux/heartburn  Pepcid Zantac Tums Prilosec Prevacid  For yeast infections  Monistat  For constipation  Colace  For minor aches and pains  Tylenol-do not take more than 4000mg in 24 hours. Therma-care or like heat packs  

## 2020-12-10 NOTE — Progress Notes (Signed)
I connected with  Holly Bridges on 12/10/20 by a video enabled telemedicine application and verified that I am speaking with the correct person using two identifiers.   I discussed the limitations of evaluation and management by telemedicine. The patient expressed understanding and agreed to proceed.   Mychart OB, c/o cough, nasal, sinus pressure

## 2020-12-10 NOTE — Progress Notes (Signed)
OBSTETRICS PRENATAL VIRTUAL VISIT ENCOUNTER NOTE  Provider location: [facility]   Patient location: Home  I connected with Holly Bridges on 12/10/20 at  3:30 PM EDT by MyChart Video Encounter and verified that I am speaking with the correct person using two identifiers. I discussed the limitations, risks, security and privacy concerns of performing an evaluation and management service virtually and the availability of in person appointments. I also discussed with the patient that there may be a patient responsible charge related to this service. The patient expressed understanding and agreed to proceed. Subjective:  Holly Bridges is a 30 y.o. [id number] at [redacted]w[redacted]d being seen today for ongoing prenatal care.  She is currently monitored for the following issues for this high-risk pregnancy and has Supervision of high risk pregnancy, antepartum; Tobacco smoking complicating pregnancy, third trimester; Uses marijuana; Twin pregnancy, delivered vaginally, current hospitalization; Late prenatal care; History of retained placenta; History of preterm premature rupture of membranes (PPROM); Postpartum anemia; Rubella non-immune status, antepartum; Heart murmur; ASCUS with positive high risk HPV cervical; Acute on chronic anemia; Pregnancy affected by fetal growth restriction; Hypertension in pregnancy, severe preeclampsia, delivered; Breech extraction, fetus 2; Preterm labor in third trimester with preterm delivery; Supervision of other high risk pregnancy, antepartum; [redacted] weeks gestation of pregnancy; Hx of preeclampsia, prior pregnancy, currently pregnant, third trimester; Back pain affecting pregnancy in third trimester; Opioid abuse (HCC); [redacted] weeks gestation of pregnancy; and History of prior pregnancy with IUGR newborn on their problem list.  Patient reports  cough/cold symptoms with congestion , low fever etc.  She is unsure of current covid status .  Contractions: Not  present. Vag. Bleeding: None.  Movement: Present. Denies any leaking of fluid.   The following portions of the patient's history were reviewed and updated as appropriate: allergies, current medications, past family history, past medical history, past social history, past surgical history and problem list.   Objective:   Vitals:   12/10/20 1516 12/10/20 1522  BP: (!) 145/90 137/84  Pulse: 98 92    Fetal Status:     Movement: Present     General:  Alert, oriented and cooperative. Patient is in no acute distress.  Respiratory: Normal respiratory effort, no problems with respiration noted  Mental Status: Normal mood and affect. Normal behavior. Normal judgment and thought content.  Rest of physical exam deferred due to type of encounter  Imaging: [place] MFM OB DETAIL +14 WK  Result Date: 11/13/2020 ----------------------------------------------------------------------  OBSTETRICS REPORT                       (Signed Final 11/13/2020 03:01 pm) ---------------------------------------------------------------------- Patient Info  ID #:       [id number]                          D.O.B.:  [birth year] (30 yrs)  Name:       Holly Bridges               Visit Date: 11/13/2020 08:27 am ---------------------------------------------------------------------- Performed By  Attending:        Lin Landsman      Ref. Address:     [address]                    [address]                                                             [place], [place]                                                             [address]  Performed By:     Clayton Lefort RDMS       [facility]  Referred By:      Brock Bad MD ---------------------------------------------------------------------- Orders  #  Description                           Code        Ordered By  1  [place] MFM OB DETAIL +14 WK               76811.01    Coral Ceo ----------------------------------------------------------------------  #  Order #                     Accession #                Episode #  1  [id number]                   [id number]                 [id number] ---------------------------------------------------------------------- Indications  Poor obstetric history: Previous               O09.299  preeclampsia / eclampsia/gestational HTN  Insufficient Prenatal Care                     O09.30  LR NIPS  Poor obstetric history: Previous preterm       O09.219  delivery, antepartum  Poor obstetric history: Previous fetal growth  O09.299  restriction (FGR)  Tobacco use complicating pregnancy, third      O99.333  trimester  [redacted] weeks gestation of pregnancy                Z3A.27 ---------------------------------------------------------------------- Fetal Evaluation  Num Of Fetuses:         1  Fetal Heart Rate(bpm):  132  Cardiac Activity:       Observed  Presentation:           Breech  Placenta:               Posterior  P. Cord Insertion:      Visualized, central  Amniotic Fluid  AFI FV:      Within normal limits  AFI Sum(cm)     %Tile       Largest Pocket(cm)  13.9            44          4.  RUQ(cm)       RLQ(cm)       LUQ(cm)        LLQ(cm)  4             3.3           2.7            3.9 ---------------------------------------------------------------------- Biometry  BPD:      68.7  mm     G. Age:  27w 4d         50  %    CI:        73.25   %    70 - 86                                                          FL/HC:      19.0   %    18.6 - 20.4  HC:      255.1  mm     G. Age:  27w 5d         34  %    HC/AC:      1.09        1.05 - 1.21  AC:      233.6  mm     G. Age:  27w 5d         55  %    FL/BPD:     70.5   %    71 - 87  FL:       48.4  mm     G. Age:   26w 2d         11  %    FL/AC:      20.7   %    20 - 24  HUM:      44.3  mm     G. Age:  26w 2d         24  %  CER:      31.7  mm     G. Age:  27w 2d         64  %  LV:        2.6  mm  CM:        6.9  mm  Est. FW:  1040  gm      2 lb 5 oz     34  % ---------------------------------------------------------------------- OB History  Gravidity:    5         Term:   2        Prem:   2  Living:       4 ---------------------------------------------------------------------- Gestational Age  LMP:           28w 3d        Date:  04/28/20                 EDD:   02/02/21  U/S Today:     27w 2d                                        EDD:   02/10/21  Best:          27w 2d     Det. By:  U/S (11/13/20)           EDD:   02/10/21 ---------------------------------------------------------------------- Anatomy  Cranium:               Appears normal         Aortic Arch:            Appears normal  Cavum:                 Appears normal         Ductal Arch:            Appears normal  Ventricles:            Appears normal         Diaphragm:              Appears normal  Choroid Plexus:        Appears normal         Stomach:                Appears normal, left                                                                        sided  Cerebellum:            Appears normal         Abdomen:                Appears normal  Posterior Fossa:       Appears normal         Abdominal Wall:         Appears nml (cord                                                                        insert, abd wall)  Nuchal Fold:           Not applicable (>20    Cord Vessels:  Appears normal ([redacted]                         wks GA)                                        vessel cord)  Face:                  Appears normal         Kidneys:                Appear normal                         (orbits and profile)  Lips:                  Appears normal         Bladder:                Appears normal  Thoracic:              Appears normal         Spine:                   Appears normal  Heart:                 Appears normal         Upper Extremities:      Appears normal                         (4CH, axis, and                         situs)  RVOT:                  Appears normal         Lower Extremities:      Appears normal  LVOT:                  Appears normal  Other:  Heels and 5th digit visualized. VC, 3VV and 3VTV visualized. Fetus          appears to be a female. ---------------------------------------------------------------------- Doppler - Fetal Vessels  Umbilical Artery   S/D     %tile      RI    %tile      PI    %tile            ADFV    RDFV   2.65       24    0.62       26    0.98       41               No      No ---------------------------------------------------------------------- Cervix Uterus Adnexa  Cervix  Not visualized (advanced GA >24wks)  Right Ovary  Visualized.  Left Ovary  Visualized. ---------------------------------------------------------------------- Comments  Ms. Nasby is a 30 yo G5P4 who is here for a detailed  examination.  She is dated by LMP which is approximate and not certain of  02/02/21  She had a low risk NIPS  no horizon performed.  Her history is significant for prior severe preeclampsia with  twin she delivered at 45  weeks 5d .  She also had a prior PPROM and delivered at 36 weeks, and  fetal growth restriction in a prior pregnancy.  Given that she was late entry to care she has not started low  dose ASA.  A single intrauterine pregnancy here for a detailed anatomy  due to late prenatal care  Normal anatomy with measurements consistent with today's  examination. Her LMP placed her at the 7th%. The UA  Dopplers were normal.  There is good fetal movement and amniotic fluid volume  Suboptimal views of the fetal anatomy were obtained  secondary to fetal position.   I discussed today's visit and findings and recommended  serial growth exams every 4-6 weeks give her prior history.  I spent 30 minutes with > 50% in face to face consultation.   Novella Olive, MD. ----------------------------------------------------------------------               Lin Landsman, MD Electronically Signed Final Report   11/13/2020 03:01 pm ----------------------------------------------------------------------   Assessment and Plan:  Pregnancy: [id number] at [redacted]w[redacted]d 1. Supervision of other high risk pregnancy, antepartum Pt advised to get a lab only appt for 2 hour GTT List of OTC meds given considering she has a cold Continue routine care, pt has growth scan this week  2. [redacted] weeks gestation of pregnancy   3. Rubella non-immune status, antepartum Treat after delivery  4. History of retained placenta Aware at time of delivery  5. Hx of preeclampsia, prior pregnancy, currently pregnant, third trimester First BP elevated, was high normal on recheck  6. History of prior pregnancy with IUGR newborn Growth scan on 10/20  Preterm labor symptoms and general obstetric precautions including but not limited to vaginal bleeding, contractions, leaking of fluid and fetal movement were reviewed in detail with the patient. I discussed the assessment and treatment plan with the patient. The patient was provided an opportunity to ask questions and all were answered. The patient agreed with the plan and demonstrated an understanding of the instructions. The patient was advised to call back or seek an in-person office evaluation/go to MAU at [facility] for any urgent or concerning symptoms. Please refer to After Visit Summary for other counseling recommendations.   I provided 15 minutes of face-to-face time during this encounter.  Return in about 2 weeks (around 12/24/2020).  Future Appointments  Date Time Provider Department Center  12/12/2020  7:30 AM [facility] NURSE [facility]  12/12/2020  7:45 AM [facility] US5 [facility]    Warden Fillers, MD [facility] [employer], [facility]

## 2020-12-11 ENCOUNTER — Ambulatory Visit: Payer: Medicaid Other

## 2020-12-12 ENCOUNTER — Ambulatory Visit: Payer: Medicaid Other | Attending: Maternal & Fetal Medicine

## 2020-12-12 ENCOUNTER — Ambulatory Visit: Payer: Medicaid Other

## 2020-12-12 ENCOUNTER — Encounter: Payer: Self-pay | Admitting: Obstetrics and Gynecology

## 2020-12-24 ENCOUNTER — Encounter: Payer: Self-pay | Admitting: Obstetrics and Gynecology

## 2020-12-24 ENCOUNTER — Other Ambulatory Visit: Payer: Self-pay

## 2020-12-24 ENCOUNTER — Other Ambulatory Visit (HOSPITAL_COMMUNITY)
Admission: RE | Admit: 2020-12-24 | Discharge: 2020-12-24 | Disposition: A | Discharge status: Home / Self Care | Payer: Medicaid Other | Source: Ambulatory Visit | Attending: Obstetrics and Gynecology | Admitting: Obstetrics and Gynecology

## 2020-12-24 ENCOUNTER — Ambulatory Visit (INDEPENDENT_AMBULATORY_CARE_PROVIDER_SITE_OTHER): Payer: Medicaid Other | Admitting: Obstetrics and Gynecology

## 2020-12-24 ENCOUNTER — Other Ambulatory Visit: Payer: Medicaid Other

## 2020-12-24 VITALS — BP 163/95 | HR 93 | Wt 151.0 lb

## 2020-12-24 DIAGNOSIS — O09293 Supervision of pregnancy with other poor reproductive or obstetric history, third trimester: Secondary | ICD-10-CM

## 2020-12-24 DIAGNOSIS — O09899 Supervision of other high risk pregnancies, unspecified trimester: Secondary | ICD-10-CM | POA: Diagnosis not present

## 2020-12-24 DIAGNOSIS — Z419 Encounter for procedure for purposes other than remedying health state, unspecified: Secondary | ICD-10-CM | POA: Diagnosis not present

## 2020-12-24 DIAGNOSIS — O10919 Unspecified pre-existing hypertension complicating pregnancy, unspecified trimester: Secondary | ICD-10-CM

## 2020-12-24 DIAGNOSIS — Z3009 Encounter for other general counseling and advice on contraception: Secondary | ICD-10-CM | POA: Diagnosis not present

## 2020-12-24 LAB — OB RESULTS CONSOLE GC/CHLAMYDIA: Gonorrhea: NEGATIVE

## 2020-12-24 NOTE — Patient Instructions (Signed)

## 2020-12-24 NOTE — Progress Notes (Signed)
+   Fetal movement. No complaints.  

## 2020-12-24 NOTE — Progress Notes (Signed)
Subjective:  Holly Bridges is a 30 y.o. [phone] at [redacted]w[redacted]d being seen today for ongoing prenatal care.  She is currently monitored for the following issues for this high-risk pregnancy and has Supervision of high risk pregnancy, antepartum; Tobacco smoking complicating pregnancy, third trimester; Uses marijuana; Late prenatal care; History of retained placenta; History of preterm premature rupture of membranes (PPROM); Rubella non-immune status, antepartum; Heart murmur; Supervision of other high risk pregnancy, antepartum; Hx of preeclampsia, prior pregnancy, currently pregnant, third trimester; Back pain affecting pregnancy in third trimester; Opioid abuse (HCC); History of prior pregnancy with IUGR newborn; and Unwanted fertility on their problem list.  Patient reports  vaginal discharge. Denies HA or visual changes .  Contractions: Not present. Vag. Bleeding: None.  Movement: Present. Denies leaking of fluid.   The following portions of the patient's history were reviewed and updated as appropriate: allergies, current medications, past family history, past medical history, past social history, past surgical history and problem list. Problem list updated.  Objective:   Vitals:   12/24/20 0852 12/24/20 0856  BP: (!) 160/96 (!) 159/96  Pulse: 93   Weight: 151 lb (68.5 kg)     Fetal Status:     Movement: Present     General:  Alert, oriented and cooperative. Patient is in no acute distress.  Skin: Skin is warm and dry. No rash noted.   Cardiovascular: Normal heart rate noted  Respiratory: Normal respiratory effort, no problems with respiration noted  Abdomen: Soft, gravid, appropriate for gestational age. Pain/Pressure: Absent     Pelvic:  Cervical exam deferred        Extremities: Normal range of motion.  Edema: Trace  Mental Status: Normal mood and affect. Normal behavior. Normal judgment and thought content.   Urinalysis:      Assessment and Plan:  Pregnancy: [id number] at [redacted]w[redacted]d  1.  Supervision of other high risk pregnancy, antepartum Stable - Glucose Tolerance, 2 Hours w/1 Hour - Cervicovaginal ancillary only( [place])  2. Hx of preeclampsia, prior pregnancy, currently pregnant, third trimester See below  3. Unwanted fertility BTL papers signed today  4. CHTN Will increase BP medication. Check labs today. Start antenatal testing BP check in 1 week  Preterm labor symptoms and general obstetric precautions including but not limited to vaginal bleeding, contractions, leaking of fluid and fetal movement were reviewed in detail with the patient. Please refer to After Visit Summary for other counseling recommendations.  Return in about 2 weeks (around 01/07/2021) for OB visit, face to face, MD only.   Hermina Staggers, MD

## 2020-12-24 NOTE — Progress Notes (Signed)
Repeat BP's today:  148/97 L arm                                  163/95 R arm

## 2020-12-25 LAB — CBC
Hematocrit: 26.5 % — ABNORMAL LOW (ref 34.0–46.6)
Hemoglobin: 8.9 g/dL — ABNORMAL LOW (ref 11.1–15.9)
MCH: 28.6 pg (ref 26.6–33.0)
MCHC: 33.6 g/dL (ref 31.5–35.7)
MCV: 85 fL (ref 79–97)
Platelets: 220 10*3/uL (ref 150–450)
RBC: 3.11 x10E6/uL — ABNORMAL LOW (ref 3.77–5.28)
RDW: 13.5 % (ref 11.7–15.4)
WBC: 10.2 10*3/uL (ref 3.4–10.8)

## 2020-12-25 LAB — COMPREHENSIVE METABOLIC PANEL
ALT: 19 IU/L (ref 0–32)
AST: 16 IU/L (ref 0–40)
Albumin/Globulin Ratio: 1.3 (ref 1.2–2.2)
Albumin: 3.6 g/dL — ABNORMAL LOW (ref 3.8–4.8)
Alkaline Phosphatase: 149 IU/L — ABNORMAL HIGH (ref 44–121)
BUN/Creatinine Ratio: 9 (ref 9–23)
BUN: 7 mg/dL (ref 6–20)
Bilirubin Total: 0.2 mg/dL (ref 0.0–1.2)
CO2: 22 mmol/L (ref 20–29)
Calcium: 8.2 mg/dL — ABNORMAL LOW (ref 8.7–10.2)
Chloride: 100 mmol/L (ref 96–106)
Creatinine, Ser: 0.79 mg/dL (ref 0.57–1.00)
Globulin, Total: 2.8 g/dL (ref 1.5–4.5)
Glucose: 83 mg/dL (ref 70–99)
Potassium: 3.3 mmol/L — ABNORMAL LOW (ref 3.5–5.2)
Sodium: 138 mmol/L (ref 134–144)
Total Protein: 6.4 g/dL (ref 6.0–8.5)
eGFR: 102 mL/min/{1.73_m2} (ref >59)

## 2020-12-25 LAB — GLUCOSE TOLERANCE, 2 HOURS W/ 1HR
Glucose, 1 hour: 109 mg/dL (ref 70–179)
Glucose, 2 hour: 82 mg/dL (ref 70–152)
Glucose, Fasting: 87 mg/dL (ref 70–91)

## 2020-12-25 LAB — CERVICOVAGINAL ANCILLARY ONLY
Chlamydia: NEGATIVE
Comment: NEGATIVE
Comment: NORMAL
Neisseria Gonorrhea: NEGATIVE

## 2020-12-25 LAB — PROTEIN / CREATININE RATIO, URINE
Creatinine, Urine: 87.6 mg/dL
Protein, Ur: 18.6 mg/dL
Protein/Creat Ratio: 212 mg/g creat — ABNORMAL HIGH (ref 0–200)

## 2020-12-26 ENCOUNTER — Ambulatory Visit: Payer: Medicaid Other

## 2020-12-26 ENCOUNTER — Ambulatory Visit: Payer: Medicaid Other | Attending: Obstetrics and Gynecology

## 2020-12-30 ENCOUNTER — Other Ambulatory Visit: Payer: Medicaid Other

## 2021-01-01 ENCOUNTER — Other Ambulatory Visit: Payer: Self-pay

## 2021-01-01 ENCOUNTER — Ambulatory Visit: Payer: Medicaid Other | Attending: Obstetrics and Gynecology

## 2021-01-01 ENCOUNTER — Encounter: Payer: Self-pay | Admitting: *Deleted

## 2021-01-01 ENCOUNTER — Ambulatory Visit (HOSPITAL_BASED_OUTPATIENT_CLINIC_OR_DEPARTMENT_OTHER): Payer: Medicaid Other | Admitting: Maternal & Fetal Medicine

## 2021-01-01 ENCOUNTER — Other Ambulatory Visit: Payer: Self-pay | Admitting: Obstetrics and Gynecology

## 2021-01-01 ENCOUNTER — Ambulatory Visit: Payer: Medicaid Other | Admitting: *Deleted

## 2021-01-01 VITALS — BP 166/110 | HR 85

## 2021-01-01 DIAGNOSIS — O0910 Supervision of pregnancy with history of ectopic or molar pregnancy, unspecified trimester: Secondary | ICD-10-CM

## 2021-01-01 DIAGNOSIS — O10919 Unspecified pre-existing hypertension complicating pregnancy, unspecified trimester: Secondary | ICD-10-CM | POA: Diagnosis not present

## 2021-01-01 DIAGNOSIS — M549 Dorsalgia, unspecified: Secondary | ICD-10-CM

## 2021-01-01 DIAGNOSIS — O163 Unspecified maternal hypertension, third trimester: Secondary | ICD-10-CM | POA: Insufficient documentation

## 2021-01-01 DIAGNOSIS — O99891 Other specified diseases and conditions complicating pregnancy: Secondary | ICD-10-CM

## 2021-01-01 NOTE — Progress Notes (Signed)
MFM Consultation  Ms. Embry is a 30 yo G5P4 who is here in consultation at the request of Dr. Nettie Elm for fetal surveillance for chronic hypertension.  Antenatal testing was performed. The biophysical profile was 8/8 with good fetal movement and amniotic fluid volume.  Ms. Renstrom blood pressures were 154/111, 167/93 and 166/111 mmHg. She denies s/sx of preeclampsia.  She was advised to go to the MAU for blood pressure management and to rule out preeclampsia.  If blood pressure as still elevated treat with IV labetalol and initate the hypertensive protocol.  Please obtain preeclampsia labs. If abnormal, consider delivery given that she is 34 week.  I discussed this plan of care with Thressa Sheller.  Recommendations: To MAU for evaluation If discharged weekly testing and follow up growth has been scheduled.  All questions answered I spent 20 minutes with > 50% in face to face consultation  Novella Olive, MD

## 2021-01-01 NOTE — Progress Notes (Signed)
Dr. Grace Bushy aware of BP and sent pt to [facility] MAU for further monitoring of BP's

## 2021-01-02 ENCOUNTER — Inpatient Hospital Stay (HOSPITAL_COMMUNITY)
Admission: AD | Admit: 2021-01-02 | Discharge: 2021-01-02 | Disposition: A | Discharge status: Home / Self Care | Payer: Medicaid Other | Attending: Obstetrics and Gynecology | Admitting: Obstetrics and Gynecology

## 2021-01-02 ENCOUNTER — Other Ambulatory Visit: Payer: Self-pay | Admitting: *Deleted

## 2021-01-02 ENCOUNTER — Inpatient Hospital Stay (HOSPITAL_COMMUNITY)
Admission: AD | Admit: 2021-01-02 | Discharge: 2021-01-02 | Disposition: A | Discharge status: Home / Self Care | Payer: Medicaid Other | Attending: Obstetrics & Gynecology | Admitting: Obstetrics & Gynecology

## 2021-01-02 ENCOUNTER — Encounter (HOSPITAL_COMMUNITY): Payer: Self-pay | Admitting: Obstetrics and Gynecology

## 2021-01-02 DIAGNOSIS — O119 Pre-existing hypertension with pre-eclampsia, unspecified trimester: Secondary | ICD-10-CM

## 2021-01-02 DIAGNOSIS — Z3A34 34 weeks gestation of pregnancy: Secondary | ICD-10-CM | POA: Insufficient documentation

## 2021-01-02 DIAGNOSIS — Z7901 Long term (current) use of anticoagulants: Secondary | ICD-10-CM | POA: Insufficient documentation

## 2021-01-02 DIAGNOSIS — O0933 Supervision of pregnancy with insufficient antenatal care, third trimester: Secondary | ICD-10-CM | POA: Insufficient documentation

## 2021-01-02 DIAGNOSIS — O10013 Pre-existing essential hypertension complicating pregnancy, third trimester: Secondary | ICD-10-CM

## 2021-01-02 DIAGNOSIS — O113 Pre-existing hypertension with pre-eclampsia, third trimester: Secondary | ICD-10-CM | POA: Diagnosis not present

## 2021-01-02 DIAGNOSIS — F17210 Nicotine dependence, cigarettes, uncomplicated: Secondary | ICD-10-CM | POA: Diagnosis not present

## 2021-01-02 DIAGNOSIS — O99333 Smoking (tobacco) complicating pregnancy, third trimester: Secondary | ICD-10-CM | POA: Diagnosis not present

## 2021-01-02 DIAGNOSIS — O09293 Supervision of pregnancy with other poor reproductive or obstetric history, third trimester: Secondary | ICD-10-CM | POA: Insufficient documentation

## 2021-01-02 DIAGNOSIS — O365930 Maternal care for other known or suspected poor fetal growth, third trimester, not applicable or unspecified: Secondary | ICD-10-CM | POA: Diagnosis not present

## 2021-01-02 DIAGNOSIS — E876 Hypokalemia: Secondary | ICD-10-CM | POA: Diagnosis not present

## 2021-01-02 DIAGNOSIS — O26893 Other specified pregnancy related conditions, third trimester: Secondary | ICD-10-CM | POA: Insufficient documentation

## 2021-01-02 DIAGNOSIS — O365990 Maternal care for other known or suspected poor fetal growth, unspecified trimester, not applicable or unspecified: Secondary | ICD-10-CM

## 2021-01-02 DIAGNOSIS — Z720 Tobacco use: Secondary | ICD-10-CM

## 2021-01-02 DIAGNOSIS — O09299 Supervision of pregnancy with other poor reproductive or obstetric history, unspecified trimester: Secondary | ICD-10-CM

## 2021-01-02 LAB — COMPREHENSIVE METABOLIC PANEL
ALT: 23 U/L (ref 0–44)
AST: 24 U/L (ref 15–41)
Albumin: 2.9 g/dL — ABNORMAL LOW (ref 3.5–5.0)
Alkaline Phosphatase: 146 U/L — ABNORMAL HIGH (ref 38–126)
Anion gap: 10 (ref 5–15)
BUN: 7 mg/dL (ref 6–20)
CO2: 23 mmol/L (ref 22–32)
Calcium: 8.6 mg/dL — ABNORMAL LOW (ref 8.9–10.3)
Chloride: 104 mmol/L (ref 98–111)
Creatinine, Ser: 0.8 mg/dL (ref 0.44–1.00)
GFR, Estimated: >60 mL/min (ref >60)
Glucose, Bld: 100 mg/dL — ABNORMAL HIGH (ref 70–99)
Potassium: 2.8 mmol/L — ABNORMAL LOW (ref 3.5–5.1)
Sodium: 137 mmol/L (ref 135–145)
Total Bilirubin: 0.5 mg/dL (ref 0.3–1.2)
Total Protein: 6.6 g/dL (ref 6.5–8.1)

## 2021-01-02 LAB — URINALYSIS, ROUTINE W REFLEX MICROSCOPIC
Bilirubin Urine: NEGATIVE
Glucose, UA: NEGATIVE mg/dL
Ketones, ur: NEGATIVE mg/dL
Nitrite: NEGATIVE
Protein, ur: 30 mg/dL — AB
RBC / HPF: >50 RBC/hpf — ABNORMAL HIGH (ref 0–5)
Specific Gravity, Urine: 1.015 (ref 1.005–1.030)
pH: 6 (ref 5.0–8.0)

## 2021-01-02 LAB — CBC
HCT: 28.1 % — ABNORMAL LOW (ref 36.0–46.0)
Hemoglobin: 9.2 g/dL — ABNORMAL LOW (ref 12.0–15.0)
MCH: 28.9 pg (ref 26.0–34.0)
MCHC: 32.7 g/dL (ref 30.0–36.0)
MCV: 88.4 fL (ref 80.0–100.0)
Platelets: 256 10*3/uL (ref 150–400)
RBC: 3.18 MIL/uL — ABNORMAL LOW (ref 3.87–5.11)
RDW: 15.1 % (ref 11.5–15.5)
WBC: 9.3 10*3/uL (ref 4.0–10.5)
nRBC: 0 % (ref 0.0–0.2)

## 2021-01-02 LAB — PROTEIN / CREATININE RATIO, URINE
Creatinine, Urine: 194.38 mg/dL
Protein Creatinine Ratio: 0.34 mg/mg{Cre} — ABNORMAL HIGH (ref 0.00–0.15)
Total Protein, Urine: 66 mg/dL

## 2021-01-02 LAB — MAGNESIUM: Magnesium: 1.9 mg/dL (ref 1.7–2.4)

## 2021-01-02 MED ORDER — LABETALOL HCL 100 MG PO TABS
200.0000 mg | ORAL_TABLET | Freq: Two times a day (BID) | ORAL | Status: DC
  Administered 2021-01-02: 200 mg via ORAL
  Filled 2021-01-02: qty 2

## 2021-01-02 MED ORDER — CYCLOBENZAPRINE HCL 5 MG PO TABS
10.0000 mg | ORAL_TABLET | Freq: Once | ORAL | Status: AC
  Administered 2021-01-02: 10 mg via ORAL
  Filled 2021-01-02: qty 2

## 2021-01-02 MED ORDER — POTASSIUM CHLORIDE CRYS ER 20 MEQ PO TBCR: 20.0000 meq | EXTENDED_RELEASE_TABLET | Freq: Two times a day (BID) | ORAL | 0 refills | Status: DC

## 2021-01-02 MED ORDER — LABETALOL HCL 200 MG PO TABS: 200.0000 mg | ORAL_TABLET | Freq: Two times a day (BID) | ORAL | 1 refills | Status: DC

## 2021-01-02 MED ORDER — LACTATED RINGERS IV SOLN: INTRAVENOUS | Status: DC

## 2021-01-02 MED ORDER — POTASSIUM CHLORIDE 10 MEQ/100ML IV SOLN
10.0000 meq | INTRAVENOUS | Status: AC
  Administered 2021-01-02 (×2): 10 meq via INTRAVENOUS
  Filled 2021-01-02 (×2): qty 100

## 2021-01-02 NOTE — MAU Provider Note (Signed)
Patient Holly Bridges is a 30 y.o. [phone]  At 75w3dhere after being seen yesterday in MFM and recommended to come to MAU for BP evaluation. She reports some flashing spots in her eyes yesterday, but none today. She denies HA, blurry vision, floating spots, RUQ pain now. She reports strong fetal movement,   She is a CHTN is on labetalol 200 mg BID. She did not take her labetalol this morning but she did take it last night.   She was here in MAU last night (early this morning)  and left after a conflict with her partner.  History     CSN: [id number] Arrival date and time: 01/02/21 1048   Event Date/Time   First Provider Initiated Contact with Patient 01/02/21 1144      Chief Complaint  Patient presents with   Hypertension   Hypertension This is a chronic problem. The current episode started more than 1 month ago. Pertinent negatives include no anxiety, blurred vision, chest pain or shortness of breath. Risk factors: pregnancy.   OB History     Gravida  5   Para  4   Term  2   Preterm  2   AB      Living  4      SAB      IAB      Ectopic      Multiple  1   Live Births  5           Past Medical History:  Diagnosis Date   Anemia    Hypertension affecting pregnancy 06/20/2019    Past Surgical History:  Procedure Laterality Date   adnoids     DILATION AND CURETTAGE OF UTERUS     TONSILLECTOMY      Family History  Problem Relation Age of Onset   Cancer Maternal Grandfather     Social History   Tobacco Use   Smoking status: Every Day    Packs/day: 0.50    Years: 5.00    Pack years: 2.50    Types: Cigarettes   Smokeless tobacco: Never   Tobacco comments:    1/2-1 pack/day  Vaping Use   Vaping Use: Never used  Substance Use Topics   Alcohol use: No   Drug use: Not Currently    Frequency: 1.0 times per week    Types: Marijuana    Comment: patient states last used marijuana 02/24/2020 as of 01/02/2021    Allergies:  Allergies   Allergen Reactions   Sulfa Antibiotics     Medications Prior to Admission  Medication Sig Dispense Refill Last Dose   aspirin 81 MG chewable tablet Chew 1 tablet (81 mg total) by mouth daily. 30 tablet 6 01/01/2021   cyclobenzaprine (FLEXERIL) 10 MG tablet Take 1 tablet by mouth three times daily as needed for muscle spasm 30 tablet 0 Past Week   Iron Polysacch Cmplx-B12-FA 150-0.025-1 MG CAPS Take 1 capsule by mouth daily before breakfast. 30 capsule 6 01/01/2021   labetalol (NORMODYNE) 100 MG tablet Take 1 tablet (100 mg total) by mouth 2 (two) times daily. 30 tablet 2 01/01/2021 at 2200   pantoprazole (PROTONIX) 40 MG tablet Take 1 tablet (40 mg total) by mouth daily. 30 tablet 5 01/01/2021   Prenatal Vit-Fe Fumarate-FA (PRENATAL MULTIVITAMIN) TABS tablet Take 1 tablet by mouth daily at 12 noon.   01/01/2021   acetaminophen (TYLENOL) 500 MG tablet Take 2 tablets (1,000 mg total) by mouth every 6 (six) hours as  needed for mild pain, moderate pain or headache. 60 tablet 1    Blood Pressure Monitor KIT Please check blood pressure 1-2 weeks. 1 kit 0    Blood Pressure Monitoring (BLOOD PRESSURE KIT) DEVI 1 kit by Does not apply route once a week. Check Blood Pressure regularly and record readings into the [employer] App.  Large Cuff.  DX O90.0 1 each 0    docusate sodium (COLACE) 100 MG capsule Take 1 capsule (100 mg total) by mouth 2 (two) times daily. (Patient not taking: Reported on 01/01/2021) 60 capsule 11    Misc. Devices (GOJJI WEIGHT SCALE) MISC 1 Device by Does not apply route once a week. (Patient not taking: Reported on 01/01/2021) 1 each 0     Review of Systems  Constitutional: Negative.   HENT: Negative.    Eyes: Negative.  Negative for blurred vision.  Respiratory: Negative.  Negative for shortness of breath.   Cardiovascular: Negative.  Negative for chest pain.  Gastrointestinal: Negative.   Neurological: Negative.   Physical Exam   Blood pressure (!) 153/92, pulse 83,  temperature 98 F (36.7 C), resp. rate 18, height $RemoveBe'5\' 5"'WfkIyPZUw$  (1.651 m), weight 68.5 kg, last menstrual period 04/28/2020, SpO2 99 %, not currently breastfeeding.  Physical Exam Abdominal:     General: Abdomen is flat.  Musculoskeletal:        General: Normal range of motion.  Skin:    General: Skin is warm.  Neurological:     General: No focal deficit present.     Mental Status: She is alert.  Psychiatric:        Mood and Affect: Mood normal.    MAU Course  Procedures  MDM -patient had prolonged monitoring while in MAU -patient has increased proteinuria in her urine, now with pre-e w/o severe features -platelets stable, LFTS stable -patient has potassium of 2.8> received 20 mEq of Potassium IV while in MAU -EKG normal; reviewed by Dr. Dione Plover.   Patient Vitals for the past 24 hrs:  BP Temp Pulse Resp SpO2 Height Weight  01/02/21 1730 125/73 -- 76 14 -- -- --  01/02/21 1633 (!) 146/90 -- 80 -- -- -- --  01/02/21 1531 138/78 -- 73 -- -- -- --  01/02/21 1516 (!) 124/93 -- 71 -- -- -- --  01/02/21 1430 126/89 -- 75 -- 99 % -- --  01/02/21 1415 129/79 -- 75 -- 99 % -- --  01/02/21 1400 134/76 -- 76 -- 99 % -- --  01/02/21 1345 130/73 -- 75 -- 99 % -- --  01/02/21 1330 124/76 -- 74 -- 99 % -- --  01/02/21 1315 129/78 -- 74 -- 99 % -- --  01/02/21 1300 134/84 -- 75 -- 99 % -- --  01/02/21 1230 128/77 -- 79 -- 99 % -- --  01/02/21 1200 (!) 145/100 -- 79 -- -- -- --  01/02/21 1145 (!) 146/86 -- 86 -- 99 % -- --  01/02/21 1130 (!) 153/92 -- 83 -- 99 % -- --  01/02/21 1129 (!) 148/93 -- 82 -- -- -- --  01/02/21 1101 (!) 156/89 98 F (36.7 C) 85 18 -- $Rem'5\' 5"'nhqU$  (1.651 m) 68.5 kg     Assessment and Plan   1. Hypokalemia   2. Chronic hypertension with superimposed pre-eclampsia    -patient now with SIPE w/o SF (no HA, no blurry vision, no floating spots, no RUQ pain and no other concerning lab findings -patient to keep follow up appts next week -patient  to pick up Potassium  replacement -magnesium level pending  Mervyn Skeeters [facility] 01/02/2021, 11:50 AM

## 2021-01-02 NOTE — MAU Note (Signed)
Sent from office for elevated b/p. Denies any headache. Stated she has had some flashes/spots yesterday morning. Good fetal movement felt.

## 2021-01-02 NOTE — MAU Note (Signed)
NOT IN LOBBY- REGISTRATION SAYS PT AND BOYFRIEND ARGUING - AND LEFT

## 2021-01-02 NOTE — MAU Note (Signed)
NOT IN LOBBY 

## 2021-01-06 ENCOUNTER — Ambulatory Visit: Payer: Medicaid Other

## 2021-01-07 ENCOUNTER — Encounter: Payer: Medicaid Other | Admitting: Obstetrics and Gynecology

## 2021-01-07 NOTE — Progress Notes (Deleted)
Patient no show for ROB. Unable to reach patient for virtual appointment. Patient will be rescheduled

## 2021-01-10 ENCOUNTER — Ambulatory Visit: Payer: Medicaid Other | Admitting: *Deleted

## 2021-01-10 ENCOUNTER — Ambulatory Visit: Payer: Medicaid Other | Attending: Maternal & Fetal Medicine

## 2021-01-10 ENCOUNTER — Other Ambulatory Visit: Payer: Self-pay

## 2021-01-10 VITALS — BP 155/98 | HR 70

## 2021-01-10 DIAGNOSIS — O09893 Supervision of other high risk pregnancies, third trimester: Secondary | ICD-10-CM | POA: Diagnosis not present

## 2021-01-10 DIAGNOSIS — O09899 Supervision of other high risk pregnancies, unspecified trimester: Secondary | ICD-10-CM | POA: Diagnosis not present

## 2021-01-10 DIAGNOSIS — O09293 Supervision of pregnancy with other poor reproductive or obstetric history, third trimester: Secondary | ICD-10-CM | POA: Diagnosis not present

## 2021-01-10 DIAGNOSIS — O99333 Smoking (tobacco) complicating pregnancy, third trimester: Secondary | ICD-10-CM | POA: Diagnosis not present

## 2021-01-10 DIAGNOSIS — O0933 Supervision of pregnancy with insufficient antenatal care, third trimester: Secondary | ICD-10-CM

## 2021-01-10 DIAGNOSIS — Z3A35 35 weeks gestation of pregnancy: Secondary | ICD-10-CM

## 2021-01-10 DIAGNOSIS — Z720 Tobacco use: Secondary | ICD-10-CM | POA: Diagnosis not present

## 2021-01-10 DIAGNOSIS — O365990 Maternal care for other known or suspected poor fetal growth, unspecified trimester, not applicable or unspecified: Secondary | ICD-10-CM | POA: Diagnosis not present

## 2021-01-10 DIAGNOSIS — O365930 Maternal care for other known or suspected poor fetal growth, third trimester, not applicable or unspecified: Secondary | ICD-10-CM | POA: Diagnosis not present

## 2021-01-10 DIAGNOSIS — O09299 Supervision of pregnancy with other poor reproductive or obstetric history, unspecified trimester: Secondary | ICD-10-CM | POA: Insufficient documentation

## 2021-01-15 ENCOUNTER — Ambulatory Visit: Payer: Medicaid Other | Attending: Maternal & Fetal Medicine

## 2021-01-15 ENCOUNTER — Encounter: Payer: Self-pay | Admitting: *Deleted

## 2021-01-15 ENCOUNTER — Ambulatory Visit: Payer: Medicaid Other | Admitting: *Deleted

## 2021-01-15 ENCOUNTER — Other Ambulatory Visit: Payer: Self-pay

## 2021-01-15 VITALS — BP 150/99 | HR 69

## 2021-01-15 DIAGNOSIS — O0933 Supervision of pregnancy with insufficient antenatal care, third trimester: Secondary | ICD-10-CM

## 2021-01-15 DIAGNOSIS — O365990 Maternal care for other known or suspected poor fetal growth, unspecified trimester, not applicable or unspecified: Secondary | ICD-10-CM | POA: Diagnosis not present

## 2021-01-15 DIAGNOSIS — O09299 Supervision of pregnancy with other poor reproductive or obstetric history, unspecified trimester: Secondary | ICD-10-CM | POA: Diagnosis not present

## 2021-01-15 DIAGNOSIS — O10913 Unspecified pre-existing hypertension complicating pregnancy, third trimester: Secondary | ICD-10-CM | POA: Insufficient documentation

## 2021-01-15 DIAGNOSIS — Z720 Tobacco use: Secondary | ICD-10-CM | POA: Diagnosis not present

## 2021-01-22 ENCOUNTER — Ambulatory Visit: Payer: Medicaid Other | Attending: Maternal & Fetal Medicine

## 2021-01-22 ENCOUNTER — Ambulatory Visit: Payer: Medicaid Other

## 2021-01-23 DIAGNOSIS — Z419 Encounter for procedure for purposes other than remedying health state, unspecified: Secondary | ICD-10-CM | POA: Diagnosis not present

## 2021-02-08 ENCOUNTER — Other Ambulatory Visit: Payer: Self-pay

## 2021-02-08 ENCOUNTER — Encounter (HOSPITAL_COMMUNITY): Payer: Self-pay | Admitting: Obstetrics and Gynecology

## 2021-02-08 ENCOUNTER — Inpatient Hospital Stay (HOSPITAL_COMMUNITY): Payer: Medicaid Other | Admitting: Anesthesiology

## 2021-02-08 ENCOUNTER — Inpatient Hospital Stay (HOSPITAL_COMMUNITY)
Admission: AD | Admit: 2021-02-08 | Discharge: 2021-02-11 | DRG: 806 | Disposition: A | Discharge status: Home / Self Care | Payer: Medicaid Other | Attending: Obstetrics and Gynecology | Admitting: Obstetrics and Gynecology

## 2021-02-08 DIAGNOSIS — Z20822 Contact with and (suspected) exposure to covid-19: Secondary | ICD-10-CM | POA: Diagnosis not present

## 2021-02-08 DIAGNOSIS — Z3A39 39 weeks gestation of pregnancy: Secondary | ICD-10-CM | POA: Diagnosis not present

## 2021-02-08 DIAGNOSIS — O9842 Viral hepatitis complicating childbirth: Secondary | ICD-10-CM | POA: Diagnosis not present

## 2021-02-08 DIAGNOSIS — F1290 Cannabis use, unspecified, uncomplicated: Secondary | ICD-10-CM | POA: Diagnosis present

## 2021-02-08 DIAGNOSIS — O26893 Other specified pregnancy related conditions, third trimester: Secondary | ICD-10-CM | POA: Diagnosis not present

## 2021-02-08 DIAGNOSIS — B1920 Unspecified viral hepatitis C without hepatic coma: Secondary | ICD-10-CM | POA: Diagnosis not present

## 2021-02-08 DIAGNOSIS — F1510 Other stimulant abuse, uncomplicated: Secondary | ICD-10-CM | POA: Diagnosis not present

## 2021-02-08 DIAGNOSIS — O1002 Pre-existing essential hypertension complicating childbirth: Secondary | ICD-10-CM | POA: Diagnosis not present

## 2021-02-08 DIAGNOSIS — F1110 Opioid abuse, uncomplicated: Secondary | ICD-10-CM | POA: Diagnosis present

## 2021-02-08 DIAGNOSIS — Z7982 Long term (current) use of aspirin: Secondary | ICD-10-CM

## 2021-02-08 DIAGNOSIS — O164 Unspecified maternal hypertension, complicating childbirth: Secondary | ICD-10-CM | POA: Diagnosis not present

## 2021-02-08 DIAGNOSIS — O99334 Smoking (tobacco) complicating childbirth: Secondary | ICD-10-CM | POA: Diagnosis not present

## 2021-02-08 DIAGNOSIS — O99333 Smoking (tobacco) complicating pregnancy, third trimester: Secondary | ICD-10-CM | POA: Diagnosis present

## 2021-02-08 DIAGNOSIS — F17210 Nicotine dependence, cigarettes, uncomplicated: Secondary | ICD-10-CM | POA: Diagnosis not present

## 2021-02-08 DIAGNOSIS — O99324 Drug use complicating childbirth: Secondary | ICD-10-CM | POA: Diagnosis not present

## 2021-02-08 DIAGNOSIS — O09899 Supervision of other high risk pregnancies, unspecified trimester: Secondary | ICD-10-CM

## 2021-02-08 DIAGNOSIS — O99320 Drug use complicating pregnancy, unspecified trimester: Secondary | ICD-10-CM | POA: Diagnosis present

## 2021-02-08 DIAGNOSIS — O1414 Severe pre-eclampsia complicating childbirth: Secondary | ICD-10-CM | POA: Diagnosis present

## 2021-02-08 DIAGNOSIS — O0930 Supervision of pregnancy with insufficient antenatal care, unspecified trimester: Secondary | ICD-10-CM

## 2021-02-08 DIAGNOSIS — O114 Pre-existing hypertension with pre-eclampsia, complicating childbirth: Secondary | ICD-10-CM | POA: Diagnosis not present

## 2021-02-08 DIAGNOSIS — F1410 Cocaine abuse, uncomplicated: Secondary | ICD-10-CM | POA: Diagnosis not present

## 2021-02-08 DIAGNOSIS — O119 Pre-existing hypertension with pre-eclampsia, unspecified trimester: Secondary | ICD-10-CM | POA: Diagnosis present

## 2021-02-08 DIAGNOSIS — O10919 Unspecified pre-existing hypertension complicating pregnancy, unspecified trimester: Secondary | ICD-10-CM | POA: Diagnosis present

## 2021-02-08 LAB — CBC
HCT: 35.1 % — ABNORMAL LOW (ref 36.0–46.0)
Hemoglobin: 11.3 g/dL — ABNORMAL LOW (ref 12.0–15.0)
MCH: 29.3 pg (ref 26.0–34.0)
MCHC: 32.2 g/dL (ref 30.0–36.0)
MCV: 90.9 fL (ref 80.0–100.0)
Platelets: 208 10*3/uL (ref 150–400)
RBC: 3.86 MIL/uL — ABNORMAL LOW (ref 3.87–5.11)
RDW: 15.1 % (ref 11.5–15.5)
WBC: 11.7 10*3/uL — ABNORMAL HIGH (ref 4.0–10.5)
nRBC: 0 % (ref 0.0–0.2)

## 2021-02-08 LAB — COMPREHENSIVE METABOLIC PANEL
ALT: 13 U/L (ref 0–44)
AST: 14 U/L — ABNORMAL LOW (ref 15–41)
Albumin: 2.6 g/dL — ABNORMAL LOW (ref 3.5–5.0)
Alkaline Phosphatase: 202 U/L — ABNORMAL HIGH (ref 38–126)
Anion gap: 8 (ref 5–15)
BUN: 9 mg/dL (ref 6–20)
CO2: 22 mmol/L (ref 22–32)
Calcium: 8.4 mg/dL — ABNORMAL LOW (ref 8.9–10.3)
Chloride: 106 mmol/L (ref 98–111)
Creatinine, Ser: 0.73 mg/dL (ref 0.44–1.00)
GFR, Estimated: >60 mL/min (ref >60)
Glucose, Bld: 90 mg/dL (ref 70–99)
Potassium: 3.6 mmol/L (ref 3.5–5.1)
Sodium: 136 mmol/L (ref 135–145)
Total Bilirubin: 0.6 mg/dL (ref 0.3–1.2)
Total Protein: 6.4 g/dL — ABNORMAL LOW (ref 6.5–8.1)

## 2021-02-08 LAB — RAPID URINE DRUG SCREEN, HOSP PERFORMED
Amphetamines: POSITIVE — AB
Barbiturates: NOT DETECTED
Benzodiazepines: NOT DETECTED
Cocaine: POSITIVE — AB
Opiates: NOT DETECTED
Tetrahydrocannabinol: NOT DETECTED

## 2021-02-08 LAB — TYPE AND SCREEN
ABO/RH(D): A POS
Antibody Screen: NEGATIVE

## 2021-02-08 LAB — PROTEIN / CREATININE RATIO, URINE
Creatinine, Urine: 109.33 mg/dL
Protein Creatinine Ratio: 0.17 mg/mg{Cre} — ABNORMAL HIGH (ref 0.00–0.15)
Total Protein, Urine: 19 mg/dL

## 2021-02-08 LAB — RESP PANEL BY RT-PCR (FLU A&B, COVID) ARPGX2
Influenza A by PCR: NEGATIVE
Influenza B by PCR: NEGATIVE
SARS Coronavirus 2 by RT PCR: NEGATIVE

## 2021-02-08 LAB — GROUP B STREP BY PCR: Group B strep by PCR: NEGATIVE

## 2021-02-08 MED ORDER — SODIUM CHLORIDE 0.9 % IV SOLN
2.0000 g | Freq: Once | INTRAVENOUS | Status: AC
  Administered 2021-02-08: 2 g via INTRAVENOUS
  Filled 2021-02-08: qty 2000

## 2021-02-08 MED ORDER — LACTATED RINGERS IV SOLN: INTRAVENOUS | Status: DC

## 2021-02-08 MED ORDER — PHENYLEPHRINE 40 MCG/ML (10ML) SYRINGE FOR IV PUSH (FOR BLOOD PRESSURE SUPPORT): 80.0000 ug | PREFILLED_SYRINGE | INTRAVENOUS | Status: DC | PRN

## 2021-02-08 MED ORDER — FENTANYL CITRATE (PF) 100 MCG/2ML IJ SOLN
100.0000 ug | INTRAMUSCULAR | Status: DC | PRN
  Administered 2021-02-08: 100 ug via INTRAVENOUS
  Filled 2021-02-08: qty 2

## 2021-02-08 MED ORDER — FENTANYL-BUPIVACAINE-NACL 0.5-0.125-0.9 MG/250ML-% EP SOLN
12.0000 mL/h | EPIDURAL | Status: DC | PRN
  Administered 2021-02-08: 12 mL/h via EPIDURAL
  Filled 2021-02-08: qty 250

## 2021-02-08 MED ORDER — LACTATED RINGERS IV SOLN: 500.0000 mL | Freq: Once | INTRAVENOUS | Status: DC

## 2021-02-08 MED ORDER — LACTATED RINGERS IV SOLN: 500.0000 mL | INTRAVENOUS | Status: DC | PRN

## 2021-02-08 MED ORDER — HYDRALAZINE HCL 20 MG/ML IJ SOLN: 10.0000 mg | INTRAMUSCULAR | Status: DC | PRN

## 2021-02-08 MED ORDER — LABETALOL HCL 5 MG/ML IV SOLN
INTRAVENOUS | Status: AC
  Administered 2021-02-08: 20 mg via INTRAVENOUS
  Filled 2021-02-08: qty 4

## 2021-02-08 MED ORDER — LABETALOL HCL 5 MG/ML IV SOLN
40.0000 mg | INTRAVENOUS | Status: DC | PRN
  Administered 2021-02-09: 01:00:00 40 mg via INTRAVENOUS
  Filled 2021-02-08: qty 8

## 2021-02-08 MED ORDER — DIPHENHYDRAMINE HCL 50 MG/ML IJ SOLN
12.5000 mg | INTRAMUSCULAR | Status: DC | PRN
  Administered 2021-02-08: 12.5 mg via INTRAVENOUS
  Filled 2021-02-08: qty 1

## 2021-02-08 MED ORDER — LIDOCAINE HCL (PF) 1 % IJ SOLN
INTRAMUSCULAR | Status: DC | PRN
  Administered 2021-02-08 (×2): 4 mL via EPIDURAL

## 2021-02-08 MED ORDER — TRANEXAMIC ACID-NACL 1000-0.7 MG/100ML-% IV SOLN
1000.0000 mg | Freq: Once | INTRAVENOUS | Status: AC
  Administered 2021-02-08: 1000 mg via INTRAVENOUS
  Filled 2021-02-08: qty 100

## 2021-02-08 MED ORDER — EPHEDRINE 5 MG/ML INJ: 10.0000 mg | INTRAVENOUS | Status: DC | PRN

## 2021-02-08 MED ORDER — HYDROXYZINE HCL 50 MG PO TABS: 50.0000 mg | ORAL_TABLET | Freq: Four times a day (QID) | ORAL | Status: DC | PRN

## 2021-02-08 MED ORDER — LABETALOL HCL 5 MG/ML IV SOLN
20.0000 mg | INTRAVENOUS | Status: DC | PRN
  Administered 2021-02-09: 20 mg via INTRAVENOUS
  Filled 2021-02-08 (×2): qty 4

## 2021-02-08 MED ORDER — LIDOCAINE HCL (PF) 1 % IJ SOLN: 30.0000 mL | INTRAMUSCULAR | Status: DC | PRN

## 2021-02-08 MED ORDER — ONDANSETRON HCL 4 MG/2ML IJ SOLN: 4.0000 mg | Freq: Four times a day (QID) | INTRAMUSCULAR | Status: DC | PRN

## 2021-02-08 MED ORDER — OXYTOCIN BOLUS FROM INFUSION
333.0000 mL | Freq: Once | INTRAVENOUS | Status: AC
  Administered 2021-02-08: 333 mL via INTRAVENOUS

## 2021-02-08 MED ORDER — LABETALOL HCL 5 MG/ML IV SOLN: 80.0000 mg | INTRAVENOUS | Status: DC | PRN

## 2021-02-08 MED ORDER — OXYTOCIN-SODIUM CHLORIDE 30-0.9 UT/500ML-% IV SOLN
2.5000 [IU]/h | INTRAVENOUS | Status: DC
  Administered 2021-02-09: 2.5 [IU]/h via INTRAVENOUS
  Filled 2021-02-08: qty 500

## 2021-02-08 MED ORDER — SODIUM CHLORIDE 0.9 % IV SOLN: 1.0000 g | INTRAVENOUS | Status: DC

## 2021-02-08 MED ORDER — ACETAMINOPHEN 325 MG PO TABS: 650.0000 mg | ORAL_TABLET | ORAL | Status: DC | PRN

## 2021-02-08 NOTE — Anesthesia Procedure Notes (Signed)
Epidural Patient location during procedure: OB Start time: 02/08/2021 1:51 PM End time: 02/08/2021 1:54 PM  Staffing Anesthesiologist: Kaylyn Layer, MD Performed: anesthesiologist   Preanesthetic Checklist Completed: patient identified, IV checked, risks and benefits discussed, monitors and equipment checked, pre-op evaluation and timeout performed  Epidural Patient position: sitting Prep: DuraPrep and site prepped and draped Patient monitoring: continuous pulse ox, blood pressure and heart rate Approach: midline Location: L3-L4 Injection technique: LOR air  Needle:  Needle type: Tuohy  Needle gauge: 17 G Needle length: 9 cm Needle insertion depth: 4 cm Catheter type: closed end flexible Catheter size: 19 Gauge Catheter at skin depth: 9 cm Test dose: negative and Other (1% lidocaine)  Assessment Events: blood not aspirated, injection not painful, no injection resistance, no paresthesia and negative IV test  Additional Notes Patient identified. Risks, benefits, and alternatives discussed with patient including but not limited to bleeding, infection, nerve damage, paralysis, failed block, incomplete pain control, headache, blood pressure changes, nausea, vomiting, reactions to medication, itching, and postpartum back pain. Confirmed with bedside nurse the patient's most recent platelet count. Confirmed with patient that they are not currently taking any anticoagulation, have any bleeding history, or any family history of bleeding disorders. Patient expressed understanding and wished to proceed. All questions were answered. Sterile technique was used throughout the entire procedure. Please see nursing notes for vital signs.   Crisp LOR on first pass. Test dose was given through epidural catheter and negative prior to continuing to dose epidural or start infusion. Warning signs of high block given to the patient including shortness of breath, tingling/numbness in hands, complete  motor block, or any concerning symptoms with instructions to call for help. Patient was given instructions on fall risk and not to get out of bed. All questions and concerns addressed with instructions to call with any issues or inadequate analgesia.  Reason for block:procedure for pain

## 2021-02-08 NOTE — Progress Notes (Signed)
Labor Progress Note Holly Bridges is a 30 y.o. [phone] at [redacted]w[redacted]d presented for labor  S:  Comfortable with epidural.   O:  BP 120/78    Pulse 67    Temp 98.3 F (36.8 C) (Oral)    Resp 18    Ht 5\' 5"  (1.651 m)    Wt 72.6 kg    LMP 04/28/2020    SpO2 100%    BMI 26.63 kg/m  EFM: baseline 135 bpm/ mod variability/ + accels/ no decels  Toco/IUPC: q4 SVE: Dilation: 7.5 Effacement (%): 80 Cervical Position: Middle Station: -1 Presentation: Vertex Exam by:: Cailen Mihalik  A/P: 30 y.o. 34 [redacted]w[redacted]d  1. Labor: active, protracted 2. FWB: Cat I 3. Pain: epidural 4. CHTN: stable  Consented for AROM, AROM'd for mod amt light MSF. Anticipate labor progress and SVD.  [redacted]w[redacted]d, CNM 6:42 PM

## 2021-02-08 NOTE — Progress Notes (Signed)
Labor Progress Note Holly Bridges is a 30 y.o. [id number] at [redacted]w[redacted]d presented for SOL.   S: Doing well, now comfortable s/p epidural.   O:  BP (!) 142/88    Pulse 64    Temp 97.6 F (36.4 C) (Axillary)    Resp 18    Ht 5\' 5"  (1.651 m)    Wt 72.6 kg    LMP 04/28/2020    SpO2 100%    BMI 26.63 kg/m  EFM: 130/mod/none/none  CVE: Dilation: 7 Effacement (%): 70 (swolen on right side) Cervical Position: Middle Station: -2 Presentation: Transverse Exam by:: [email or web address], DO   A&P: 30 y.o. 34 [redacted]w[redacted]d  #Labor: Protracted, now with some cervical swelling predominantly on the right with fetal head positioning transverse in the pelvis. Will place on to her lateral side with a peanut and give IV benadryl.  #Pain: Epidural  #FWB: Cat 1  #GBS negative via PCR   #cHTN with SIPE: Current BP 140's systolic. No concerning symptoms. Pre-e labs WNL. Cont to monitor.    [redacted]w[redacted]d, DO 9:17 PM

## 2021-02-08 NOTE — MAU Note (Signed)
Holly Bridges is a 30 y.o. at [redacted]w[redacted]d here in MAU reporting: registration called back stating patient feels like she needs to push. Pt brought to room 130. Reports contractions started last night. No bleeding or LOF. +FM  5.5/80/-1 vtx by Raelyn Mora CNM  Onset of complaint: last night  Pain score: 8/10  There were no vitals filed for this visit.   BEM:754  Lab orders placed from triage: none

## 2021-02-08 NOTE — MAU Provider Note (Signed)
Ms. KEESHIA SANDERLIN is a [id number] at [redacted]w[redacted]d seen in MAU for labor. RN labor check, seen by provider d/t patient stating at registration desk feeling the urge to push. SVE by CNM Dilation: 5.5 Effacement (%): 70 Cervical Position: Middle Station: -1 Presentation: Vertex Exam by:: Carloyn Jaeger, CNM   NST - FHR: 120 bpm / moderate variability / accels present / decels absent / TOCO: regular every 4 mins   Plan: Admit to L&D Routine L&D orders Rapid GBS Labor team notified by RN   Raelyn Mora, CNM  02/08/2021 12:51 PM

## 2021-02-08 NOTE — H&P (Signed)
OBSTETRIC ADMISSION HISTORY AND PHYSICAL  Holly Bridges is a 30 y.o. female [phone] with IUP at 66w5dpresenting for labor. She reports +FMs. No LOF, VB, blurry vision, headaches, peripheral edema, or RUQ pain. She plans on bottlefeeding. She requests BTL for birth control.  Dating: By 27w [place]--->  Estimated Date of Delivery: 02/10/21  Sono:    _0 , normal anatomy, ceph presentation, 2339g, 14%ile, EFW 5'3  Prenatal History/Complications: -CHTN; dx with si PEC _1  wks - tobacco use - Marijuana use - hx PTD _2 , twins - OUD with accidental OD 2 mos ago - Hx PEC in previous pregnancy - Hx of FGR in previous pregnancy - Rubella NI - Abnormal papsmear- needs colpo - Hx Hep C, treated - Anemia  Past Medical History: Past Medical History:  Diagnosis Date   Anemia    Hypertension affecting pregnancy 06/20/2019    Past Surgical History: Past Surgical History:  Procedure Laterality Date   adnoids     DILATION AND CURETTAGE OF UTERUS     TONSILLECTOMY      Obstetrical History: OB History     Gravida  5   Para  4   Term  2   Preterm  2   AB      Living  4      SAB      IAB      Ectopic      Multiple  1   Live Births  5           Social History: Social History   Socioeconomic History   Marital status: Single    Spouse name: Not on file   Number of children: Not on file   Years of education: Not on file   Highest education level: Not on file  Occupational History   Not on file  Tobacco Use   Smoking status: Every Day    Packs/day: 0.50    Years: 5.00    Pack years: 2.50    Types: Cigarettes   Smokeless tobacco: Never   Tobacco comments:    1/2-1 pack/day  Vaping Use   Vaping Use: Never used  Substance and Sexual Activity   Alcohol use: No   Drug use: Not Currently    Frequency: 1.0 times per week    Types: Marijuana    Comment: patient states last used marijuana 02/24/2020 as of 01/02/2021   Sexual activity: Yes  Other Topics  Concern   Not on file  Social History Narrative   Not on file   Social Determinants of Health   Financial Resource Strain: Not on file  Food Insecurity: Not on file  Transportation Needs: Not on file  Physical Activity: Not on file  Stress: Not on file  Social Connections: Not on file    Family History: Family History  Problem Relation Age of Onset   Cancer Maternal Grandfather     Allergies: Allergies  Allergen Reactions   Sulfa Antibiotics     Medications Prior to Admission  Medication Sig Dispense Refill Last Dose   acetaminophen (TYLENOL) 500 MG tablet Take 2 tablets (1,000 mg total) by mouth every 6 (six) hours as needed for mild pain, moderate pain or headache. 60 tablet 1    aspirin 81 MG chewable tablet Chew 1 tablet (81 mg total) by mouth daily. 30 tablet 6    Blood Pressure Monitor KIT Please check blood pressure 1-2 weeks. 1 kit 0    Blood Pressure Monitoring (BLOOD PRESSURE KIT)  DEVI 1 kit by Does not apply route once a week. Check Blood Pressure regularly and record readings into the [employer] App.  Large Cuff.  DX O90.0 1 each 0    cyclobenzaprine (FLEXERIL) 10 MG tablet Take 1 tablet by mouth three times daily as needed for muscle spasm 30 tablet 0    docusate sodium (COLACE) 100 MG capsule Take 1 capsule (100 mg total) by mouth 2 (two) times daily. (Patient not taking: Reported on 01/01/2021) 60 capsule 11    Iron Polysacch Cmplx-B12-FA 150-0.025-1 MG CAPS Take 1 capsule by mouth daily before breakfast. 30 capsule 6    labetalol (NORMODYNE) 100 MG tablet Take 1 tablet (100 mg total) by mouth 2 (two) times daily. 30 tablet 2    labetalol (NORMODYNE) 200 MG tablet Take 1 tablet (200 mg total) by mouth 2 (two) times daily. 90 tablet 1    Misc. Devices (GOJJI WEIGHT SCALE) MISC 1 Device by Does not apply route once a week. (Patient not taking: Reported on 01/01/2021) 1 each 0    pantoprazole (PROTONIX) 40 MG tablet Take 1 tablet (40 mg total) by mouth daily. 30  tablet 5    potassium chloride SA (KLOR-CON) 20 MEQ tablet Take 1 tablet (20 mEq total) by mouth 2 (two) times daily. (Patient not taking: Reported on 01/10/2021) 6 tablet 0    Prenatal Vit-Fe Fumarate-FA (PRENATAL MULTIVITAMIN) TABS tablet Take 1 tablet by mouth daily at 12 noon.        Review of Systems:  All systems reviewed and negative except as stated in HPI  PE: Blood pressure 119/86, pulse 63, temperature (!) 96.7 F (35.9 C), temperature source Axillary, resp. rate 18, height _0  (1.651 m), weight 72.6 kg, last menstrual period 04/28/2020, SpO2 100 %, not currently breastfeeding. General appearance: alert, cooperative, and no distress Lungs: regular rate and effort Heart: regular rate  Abdomen: soft, non-tender Extremities: Homans sign is negative, no sign of DVT Presentation: cephalic EFM: 160 bpm, mod variability, + accels, rare variable decels Toco: 2-5 Dilation: 8 Effacement (%): 90 Station: -1 Exam by:: k fields, rn  Prenatal labs: ABO, Rh: A/Positive/-- (08/30 1038) Antibody: Negative (08/30 1038) Rubella: 1.01 (08/30 1038) RPR: Non Reactive (10/04 1156)  HBsAg: Negative (08/30 1038)  HIV: Non Reactive (10/04 1156)  GBS: NEGATIVE/-- (12/17 1250)unknown 2 hr GTT nml  Prenatal Transfer Tool  Maternal Diabetes: No Genetic Screening: Normal Maternal Ultrasounds/Referrals: Normal Fetal Ultrasounds or other Referrals:  Referred to Materal Fetal Medicine  Maternal Substance Abuse:  Yes:  Type: Smoker, Marijuana, Cocaine Significant Maternal Medications:  None Significant Maternal Lab Results: Other: Hep C  Results for orders placed or performed during the hospital encounter of 02/08/21 (from the past 24 hour(s))  Resp Panel by RT-PCR (Flu A&B, Covid) Nasopharyngeal Swab   Collection Time: 02/08/21 12:48 PM   Specimen: Nasopharyngeal Swab; Nasopharyngeal(NP) swabs in vial transport medium  Result Value Ref Range   SARS Coronavirus 2 by RT PCR NEGATIVE  NEGATIVE   Influenza A by PCR NEGATIVE NEGATIVE   Influenza B by PCR NEGATIVE NEGATIVE  Group B strep by PCR   Collection Time: 02/08/21 12:50 PM   Specimen: Vaginal/Rectal; Genital  Result Value Ref Range   Group B strep by PCR NEGATIVE NEGATIVE  CBC   Collection Time: 02/08/21 12:51 PM  Result Value Ref Range   WBC 11.7 (H) 4.0 - 10.5 K/uL   RBC 3.86 (L) 3.87 - 5.11 MIL/uL   Hemoglobin 11.3 (L) 12.0 -  15.0 g/dL   HCT 35.1 (L) 36.0 - 46.0 %   MCV 90.9 80.0 - 100.0 fL   MCH 29.3 26.0 - 34.0 pg   MCHC 32.2 30.0 - 36.0 g/dL   RDW 15.1 11.5 - 15.5 %   Platelets 208 150 - 400 K/uL   nRBC 0.0 0.0 - 0.2 %    Patient Active Problem List   Diagnosis Date Noted   Indication for care in labor or delivery 02/08/2021   Unwanted fertility 12/24/2020   Chronic hypertension affecting pregnancy 12/24/2020   History of prior pregnancy with IUGR newborn 12/10/2020   Opioid abuse ([place]) 12/01/2020   Hx of preeclampsia, prior pregnancy, currently pregnant, third trimester 11/26/2020   Back pain affecting pregnancy in third trimester 11/26/2020   Supervision of other high risk pregnancy, antepartum 08/27/2020   Rubella non-immune status, antepartum 05/11/2019   Heart murmur 05/11/2019   Supervision of high risk pregnancy, antepartum 05/10/2019   Tobacco smoking complicating pregnancy, third trimester 05/10/2019   Uses marijuana 05/10/2019   Late prenatal care 05/10/2019   History of retained placenta 05/10/2019   History of preterm premature rupture of membranes (PPROM) 05/10/2019    Assessment: Holly Bridges is a 30 y.o. [id number] at 37w5dhere for labor  1. Labor: active 2. FWB: Cat II 3. Pain: epidural 4. GBS: unknown   Plan: Admit to LD Analgesia/anesthesia prn GBS pcr UDS Anticipate labor progress and SVD  MJulianne Handler CNM  02/08/2021, 2:52 PM

## 2021-02-08 NOTE — Anesthesia Preprocedure Evaluation (Addendum)
Anesthesia Evaluation  Patient identified by MRN, date of birth, ID band Patient awake    Reviewed: Allergy & Precautions, Patient's Chart, lab work & pertinent test results  History of Anesthesia Complications Negative for: history of anesthetic complications  Airway Mallampati: II  TM Distance: >3 FB Neck ROM: Full    Dental  (+) Missing, Poor Dentition,    Pulmonary Current Smoker,    Pulmonary exam normal        Cardiovascular hypertension, Normal cardiovascular exam     Neuro/Psych negative neurological ROS  negative psych ROS   GI/Hepatic negative GI ROS, (+)     substance abuse  marijuana use,   Endo/Other  negative endocrine ROS  Renal/GU negative Renal ROS  negative genitourinary   Musculoskeletal negative musculoskeletal ROS (+)   Abdominal   Peds  Hematology negative hematology ROS (+)   Anesthesia Other Findings Day of surgery medications reviewed with patient.  Reproductive/Obstetrics (+) Pregnancy                            Anesthesia Physical Anesthesia Plan  ASA: 2  Anesthesia Plan: Epidural   Post-op Pain Management:    Induction:   PONV Risk Score and Plan: Treatment may vary due to age or medical condition  Airway Management Planned: Natural Airway  Additional Equipment: Fetal Monitoring  Intra-op Plan:   Post-operative Plan:   Informed Consent: I have reviewed the patients History and Physical, chart, labs and discussed the procedure including the risks, benefits and alternatives for the proposed anesthesia with the patient or authorized representative who has indicated his/her understanding and acceptance.       Plan Discussed with:   Anesthesia Plan Comments:         Anesthesia Quick Evaluation

## 2021-02-09 ENCOUNTER — Encounter (HOSPITAL_COMMUNITY): Payer: Self-pay | Admitting: Obstetrics and Gynecology

## 2021-02-09 DIAGNOSIS — O0930 Supervision of pregnancy with insufficient antenatal care, unspecified trimester: Secondary | ICD-10-CM

## 2021-02-09 DIAGNOSIS — O119 Pre-existing hypertension with pre-eclampsia, unspecified trimester: Secondary | ICD-10-CM | POA: Diagnosis present

## 2021-02-09 DIAGNOSIS — O99320 Drug use complicating pregnancy, unspecified trimester: Secondary | ICD-10-CM | POA: Diagnosis present

## 2021-02-09 LAB — RPR: RPR Ser Ql: NONREACTIVE

## 2021-02-09 MED ORDER — COCONUT OIL OIL: 1.0000 "application " | TOPICAL_OIL | Status: DC | PRN

## 2021-02-09 MED ORDER — FUROSEMIDE 40 MG PO TABS
20.0000 mg | ORAL_TABLET | Freq: Two times a day (BID) | ORAL | Status: DC
  Administered 2021-02-09 – 2021-02-11 (×6): 20 mg via ORAL
  Filled 2021-02-09 (×6): qty 1

## 2021-02-09 MED ORDER — NIFEDIPINE ER OSMOTIC RELEASE 60 MG PO TB24
60.0000 mg | ORAL_TABLET | Freq: Every day | ORAL | Status: DC
  Administered 2021-02-10: 11:00:00 60 mg via ORAL
  Filled 2021-02-09: qty 1

## 2021-02-09 MED ORDER — PRENATAL MULTIVITAMIN CH
1.0000 | ORAL_TABLET | Freq: Every day | ORAL | Status: DC
  Administered 2021-02-09 – 2021-02-11 (×2): 1 via ORAL
  Filled 2021-02-09 (×3): qty 1

## 2021-02-09 MED ORDER — SODIUM CHLORIDE 0.9% FLUSH: 3.0000 mL | INTRAVENOUS | Status: DC | PRN

## 2021-02-09 MED ORDER — NIFEDIPINE ER OSMOTIC RELEASE 30 MG PO TB24
30.0000 mg | ORAL_TABLET | Freq: Every day | ORAL | Status: DC
  Administered 2021-02-09: 04:00:00 30 mg via ORAL
  Filled 2021-02-09: qty 1

## 2021-02-09 MED ORDER — ACETAMINOPHEN 325 MG PO TABS
650.0000 mg | ORAL_TABLET | ORAL | Status: DC | PRN
  Administered 2021-02-09 (×2): 650 mg via ORAL
  Filled 2021-02-09 (×2): qty 2

## 2021-02-09 MED ORDER — ZOLPIDEM TARTRATE 5 MG PO TABS: 5.0000 mg | ORAL_TABLET | Freq: Every evening | ORAL | Status: DC | PRN

## 2021-02-09 MED ORDER — DIPHENHYDRAMINE HCL 25 MG PO CAPS: 25.0000 mg | ORAL_CAPSULE | Freq: Four times a day (QID) | ORAL | Status: DC | PRN

## 2021-02-09 MED ORDER — DIBUCAINE (PERIANAL) 1 % EX OINT: 1.0000 "application " | TOPICAL_OINTMENT | CUTANEOUS | Status: DC | PRN

## 2021-02-09 MED ORDER — SODIUM CHLORIDE 0.9 % IV SOLN: 250.0000 mL | INTRAVENOUS | Status: DC | PRN

## 2021-02-09 MED ORDER — SIMETHICONE 80 MG PO CHEW: 80.0000 mg | CHEWABLE_TABLET | ORAL | Status: DC | PRN

## 2021-02-09 MED ORDER — WITCH HAZEL-GLYCERIN EX PADS: 1.0000 "application " | MEDICATED_PAD | CUTANEOUS | Status: DC | PRN

## 2021-02-09 MED ORDER — ONDANSETRON HCL 4 MG PO TABS: 4.0000 mg | ORAL_TABLET | ORAL | Status: DC | PRN

## 2021-02-09 MED ORDER — IBUPROFEN 600 MG PO TABS
600.0000 mg | ORAL_TABLET | Freq: Four times a day (QID) | ORAL | Status: DC
  Administered 2021-02-09 – 2021-02-11 (×11): 600 mg via ORAL
  Filled 2021-02-09 (×11): qty 1

## 2021-02-09 MED ORDER — NIFEDIPINE ER OSMOTIC RELEASE 30 MG PO TB24: 30.0000 mg | ORAL_TABLET | Freq: Every day | ORAL | Status: DC

## 2021-02-09 MED ORDER — TETANUS-DIPHTH-ACELL PERTUSSIS 5-2.5-18.5 LF-MCG/0.5 IM SUSY: 0.5000 mL | PREFILLED_SYRINGE | Freq: Once | INTRAMUSCULAR | Status: DC

## 2021-02-09 MED ORDER — SODIUM CHLORIDE 0.9% FLUSH
3.0000 mL | Freq: Two times a day (BID) | INTRAVENOUS | Status: DC
  Administered 2021-02-09 – 2021-02-11 (×2): 3 mL via INTRAVENOUS

## 2021-02-09 MED ORDER — ONDANSETRON HCL 4 MG/2ML IJ SOLN: 4.0000 mg | INTRAMUSCULAR | Status: DC | PRN

## 2021-02-09 MED ORDER — BENZOCAINE-MENTHOL 20-0.5 % EX AERO: 1.0000 "application " | INHALATION_SPRAY | CUTANEOUS | Status: DC | PRN

## 2021-02-09 MED ORDER — MEASLES, MUMPS & RUBELLA VAC IJ SOLR: 0.5000 mL | Freq: Once | INTRAMUSCULAR | Status: DC

## 2021-02-09 MED ORDER — SENNOSIDES-DOCUSATE SODIUM 8.6-50 MG PO TABS
2.0000 | ORAL_TABLET | ORAL | Status: DC
  Administered 2021-02-09 – 2021-02-11 (×3): 2 via ORAL
  Filled 2021-02-09 (×3): qty 2

## 2021-02-09 MED ORDER — NIFEDIPINE 10 MG PO CAPS
10.0000 mg | ORAL_CAPSULE | Freq: Once | ORAL | Status: AC
  Administered 2021-02-09: 21:00:00 10 mg via ORAL
  Filled 2021-02-09: qty 1

## 2021-02-09 NOTE — Progress Notes (Signed)
Daily Postpartum Note  Admission Date: 02/08/2021 Current Date: 02/09/2021 7:34 AM  Holly Bridges is a 30 y.o. [id number] PPD#1 (delivered just before midnight) @ [redacted]w[redacted]d. Patient admitted for labor.  Pregnancy complicated by: Patient Active Problem List   Diagnosis Date Noted   Chronic hypertension with superimposed pre-eclampsia 02/09/2021   Cocaine use complicating pregnancy 01/75/1025   Insufficient prenatal care 02/09/2021   Indication for care in labor or delivery 02/08/2021   Unwanted fertility 12/24/2020   Chronic hypertension affecting pregnancy 12/24/2020   History of prior pregnancy with IUGR newborn 12/10/2020   Opioid abuse ([place]) 12/01/2020   Hx of preeclampsia, prior pregnancy, currently pregnant, third trimester 11/26/2020   Back pain affecting pregnancy in third trimester 11/26/2020   Supervision of other high risk pregnancy, antepartum 08/27/2020   Rubella non-immune status, antepartum 05/11/2019   Heart murmur 05/11/2019   Supervision of high risk pregnancy, antepartum 05/10/2019   Tobacco smoking complicating pregnancy, third trimester 05/10/2019   Uses marijuana 05/10/2019   Late prenatal care 05/10/2019   History of retained placenta 05/10/2019   History of preterm premature rupture of membranes (PPROM) 05/10/2019    Overnight/24hr events:  Patient needed IV labetalol just after delivery and started on procardia xl 30 qday at that time too  Subjective:  Patient resting comfortably. No s/s of pre-eclampsia  Objective:    Current Vital Signs 24h Vital Sign Ranges  T 98.6 F (37 C) Temp  Avg: 98 F (36.7 C)  Min: 96.7 F (35.9 C)  Max: 98.6 F (37 C)  BP (!) 142/72  BP  Min: 119/86  Max: 184/104  HR 73 Pulse  Avg: 70.3  Min: 62  Max: 97  RR 20 Resp  Avg: 18.2  Min: 18  Max: 20  SaO2 100 % Room Air SpO2  Avg: 99.4 %  Min: 98 %  Max: 100 %       24 Hour I/O Current Shift I/O  Time Ins Outs 12/17 0701 - 12/18 0700 In: 0  Out: 1150 [Urine:1025] No  intake/output data recorded.   Patient Vitals for the past 24 hrs:  BP Temp Temp src Pulse Resp SpO2 Height Weight  02/09/21 0627 (!) 142/72 98.6 F (37 C) Oral 73 -- 100 % -- --  02/09/21 0257 140/76 98.4 F (36.9 C) Oral 66 -- 98 % -- --  02/09/21 0159 (!) 146/97 97.7 F (36.5 C) Oral 73 20 99 % -- --  02/09/21 0117 132/70 -- -- 97 -- -- -- --  02/09/21 0100 (!) 144/92 -- -- 64 -- -- -- --  02/09/21 0046 (!) 145/90 98.3 F (36.8 C) Oral 69 -- -- -- --  02/09/21 0045 (!) 152/90 -- -- 66 -- -- -- --  02/09/21 0032 (!) 165/99 -- -- 76 -- -- -- --  02/09/21 0029 (!) 168/98 -- -- 74 -- -- -- --  02/09/21 0017 (!) 173/100 -- -- 70 -- -- -- --  02/09/21 0000 (!) 167/111 -- -- 84 -- -- -- --  02/08/21 2348 (!) 155/118 -- -- 81 -- -- -- --  02/08/21 2345 (!) 146/100 -- -- 74 18 -- -- --  02/08/21 2333 (!) 159/98 -- -- 74 18 100 % -- --  02/08/21 2300 (!) 149/87 -- -- 69 -- -- -- --  02/08/21 2253 (!) 149/74 -- -- 71 -- -- -- --  02/08/21 2200 (!) 152/95 98.6 F (37 C) -- 67 -- -- -- --  02/08/21 2132 139/84 -- --  68 -- -- -- --  02/08/21 2100 (!) 142/88 -- -- 64 -- -- -- --  02/08/21 2030 (!) 141/81 -- -- 78 -- -- -- --  02/08/21 2000 (!) 147/94 -- -- 74 -- -- -- --  02/08/21 1930 (!) 147/95 -- -- 70 -- -- -- --  02/08/21 1927 -- 97.6 F (36.4 C) Axillary -- 18 -- -- --  02/08/21 1901 140/83 -- -- 66 -- -- -- --  02/08/21 1859 -- 97.6 F (36.4 C) Oral -- 18 -- -- --  02/08/21 1831 120/78 -- -- 67 18 -- -- --  02/08/21 1731 135/80 98.3 F (36.8 C) Oral 70 18 -- -- --  02/08/21 1701 139/83 -- -- 73 18 -- -- --  02/08/21 1631 (!) 146/84 -- -- 77 18 -- -- --  02/08/21 1601 133/85 -- -- 62 18 -- -- --  02/08/21 1531 131/78 -- -- 62 18 -- -- --  02/08/21 1505 133/87 -- -- 64 18 -- -- --  02/08/21 1431 119/86 -- -- 63 -- -- -- --  02/08/21 1427 136/83 -- -- 66 18 -- -- --  02/08/21 1422 (!) 145/103 -- -- 68 18 -- -- --  02/08/21 1416 (!) 164/99 -- -- 65 18 -- -- --  02/08/21 1411  (!) 155/105 -- -- 66 18 -- -- --  02/08/21 1406 (!) 163/108 -- -- 66 18 -- -- --  02/08/21 1401 (!) 168/111 -- -- 74 18 -- -- --  02/08/21 1359 (!) 184/104 -- -- 67 18 -- -- --  02/08/21 1354 (!) 157/98 -- -- 73 18 -- -- --  02/08/21 1330 -- -- -- -- -- -- 5' 5"  (1.651 m) 72.6 kg  02/08/21 1321 (!) 156/93 97.8 F (36.6 C) Oral 66 18 -- -- --  02/08/21 1257 (!) 167/98 -- -- 64 -- -- -- --  02/08/21 1242 (!) 175/98 (!) 96.7 F (35.9 C) Axillary 70 20 100 % -- --   Physical exam: General: patient resting in bed with partner Respiratory: no respiratory distress Neuro: grossly normal Skin: Warm and dry.   Medications: Current Facility-Administered Medications  Medication Dose Route Frequency Provider Last Rate Last Admin   sodium chloride flush (NS) 0.9 % injection 3 mL  3 mL Intravenous Q12H Higinio Plan, Samantha N, DO   3 mL at 02/09/21 0631   And   sodium chloride flush (NS) 0.9 % injection 3 mL  3 mL Intravenous PRN Higinio Plan, Samantha N, DO       And   0.9 %  sodium chloride infusion  250 mL Intravenous PRN Higinio Plan, Samantha N, DO       acetaminophen (TYLENOL) tablet 650 mg  650 mg Oral Q4H PRN Beard, Samantha N, DO       benzocaine-Menthol (DERMOPLAST) 20-0.5 % topical spray 1 application  1 application Topical PRN Beard, Samantha N, DO       coconut oil  1 application Topical PRN Patriciaann Clan, DO       witch hazel-glycerin (TUCKS) pad 1 application  1 application Topical PRN Patriciaann Clan, DO       And   dibucaine (NUPERCAINAL) 1 % rectal ointment 1 application  1 application Rectal PRN Patriciaann Clan, DO       diphenhydrAMINE (BENADRYL) capsule 25 mg  25 mg Oral Q6H PRN Beard, Samantha N, DO       ibuprofen (ADVIL) tablet 600 mg  600 mg Oral Q6H Beard, Ralph Leyden, DO  600 mg at 02/09/21 0631   [START ON 02/10/2021] measles, mumps & rubella vaccine (MMR) injection 0.5 mL  0.5 mL Subcutaneous Once Beard, Samantha N, DO       NIFEdipine (PROCARDIA-XL/NIFEDICAL-XL) 24 hr tablet 30  mg  30 mg Oral Daily Beard, Samantha N, DO   30 mg at 02/09/21 0330   ondansetron (ZOFRAN) tablet 4 mg  4 mg Oral Q4H PRN Patriciaann Clan, DO       Or   ondansetron (ZOFRAN) injection 4 mg  4 mg Intravenous Q4H PRN Darrelyn Hillock N, DO       prenatal multivitamin tablet 1 tablet  1 tablet Oral Q1200 Beard, Samantha N, DO       senna-docusate (Senokot-S) tablet 2 tablet  2 tablet Oral Q24H Higinio Plan, Samantha N, DO   2 tablet at 02/09/21 0330   simethicone (MYLICON) chewable tablet 80 mg  80 mg Oral PRN Patriciaann Clan, DO       [START ON 02/10/2021] Tdap (BOOSTRIX) injection 0.5 mL  0.5 mL Intramuscular Once Patriciaann Clan, DO        Labs:  UDS: +cocaine, amphetamines Pending: RPR Recent Labs  Lab 02/08/21 1251  WBC 11.7*  HGB 11.3*  HCT 35.1*  PLT 208   Recent Labs  Lab 02/08/21 1305  NA 136  K 3.6  CL 106  CO2 22  BUN 9  CREATININE 0.73  CALCIUM 8.4*  PROT 6.4*  BILITOT 0.6  ALKPHOS 202*  ALT 13  AST 14*  GLUCOSE 90   A POS  Radiology: none  Assessment & Plan:  Patient stable *PP: routine care pt changed her mind and does not want BTL. Options d/w her, including what she can get postpartum. Follow up tomorrow. Baby in NICU *Social: SW consulted *cHTN with superimposed mild pre-eclampsia: continue to follow closely. No e/o pre-eclampsia currently *Dispo: request sent to [facility] for 12/23 in person bp check   Durene Romans MD Attending Center for [facility]) [place] Phone: [phone] (M-F, [phone]) & [phone]  (Off hours, weekends, holidays)

## 2021-02-09 NOTE — Lactation Note (Signed)
This note was copied from a baby's chart. Lactation Consultation Note  Patient Name: Boy Brunetta Newingham [id number] Date: 02/09/2021 Age:32 hours  Formula feeding only due to maternal hx. Confirmed with Marquis Buggy, RN on [facility].  Feeding Mother's Current Feeding Choice: Formula  Consult Status Consult Status: Complete    Skyllar Notarianni A Higuera Ancidey 02/09/2021, 4:19 PM

## 2021-02-09 NOTE — Discharge Summary (Signed)
Postpartum Discharge Summary     Patient Name: Holly Bridges DOB: 1990/07/30 MRN: [id number]  Date of admission: 02/08/2021 Delivery date:02/08/2021  Delivering provider: Patriciaann Clan  Date of discharge: 02/11/2021  Admitting diagnosis: Indication for care in labor or delivery [O75.9] Intrauterine pregnancy: [redacted]w[redacted]d    Secondary diagnosis:  Principal Problem:   Hypertension in pregnancy, severe preeclampsia, delivered Active Problems:   Tobacco smoking complicating pregnancy, third trimester   Uses marijuana   Late prenatal care   Rubella non-immune status, antepartum   [place] abuse ([place]   Chronic hypertension affecting pregnancy   Chronic hypertension with superimposed pre-eclampsia   Cocaine use complicating pregnancy   Insufficient prenatal care   Amphetamine abuse ([place]  Additional problems: None   Discharge diagnosis: Term Pregnancy Delivered and CHTN with superimposed preeclampsia                                              Post partum procedures: None Augmentation: AROM Complications: None  Hospital course: Onset of Labor With Vaginal Delivery:      30y.o. yo [id number] admitted in Active Labor on 02/08/2021.  [facility], cocaine and amphetamine abuse.  Patient had an uncomplicated labor course as follows:  Membrane Rupture Time/Date: 6:03 PM ,02/08/2021   Delivery Method:Vaginal, Spontaneous  Episiotomy: None  Lacerations:  None  Details of delivery can be found in separate delivery note.  Patient had difficult to control BPs after delivery, was placed on Procardia XL 90 mg daily and she was started on Lasix 20 mg po bid x 5 days. She was noted to intermittently have severe range BP that was treated.  On PPD#2, she did have a severe range BP in the morning but BP improved with medication and was started on Enalapril 5 mg daily.  The plan was to watch her overnight, however patient wanted to go  home and follow up in the office on 02/13/21 due to CPS issues.  Otherwise, patient had a routine postpartum course. She is ambulating, tolerating a regular diet, passing flatus, and urinating well. Patient is discharged home in stable condition on 02/11/2021, and will follow up in the office for BP check on 02/13/2021; it was emphasized that she follow up as recommended.  Message was sent to [facility] to call with specific appointment details.  Newborn Data: Birth date:02/08/2021  Birth time:11:39 PM  Gender:Female  Living status:Living  Apgars:9 ,9  Weight:2750 g   Magnesium Sulfate received: No BMZ received: No Rhophylac:No MMR: Will offer  T-DaP:Given prenatally   Physical exam  Vitals:   02/11/21 1117 02/11/21 1128 02/11/21 1349 02/11/21 1827  BP: (!) 167/105 (!) 148/97 (!) 158/91 (!) 145/92  Pulse: 86 85 91 (!) 102  Resp: 16 16 16 18   Temp: 98.4 F (36.9 C)   98.2 F (36.8 C)  TempSrc: Oral   Oral  SpO2: 100%   100%  Weight:      Height:       General: alert, cooperative, and no distress Lochia: appropriate Uterine Fundus: firm Incision: N/A DVT Evaluation: No evidence of DVT seen on physical exam. Negative Homan's sign. No cords or calf tenderness. No significant calf/ankle edema. Calf/Ankle edema is present Labs: Lab Results  Component Value Date   WBC 11.7 (H) 02/08/2021   HGB 11.3 (L) 02/08/2021  HCT 35.1 (L) 02/08/2021   MCV 90.9 02/08/2021   PLT 208 02/08/2021   CMP Latest Ref Rng & Units 02/08/2021  Glucose 70 - 99 mg/dL 90  BUN 6 - 20 mg/dL 9  Creatinine 0.44 - 1.00 mg/dL 0.73  Sodium 135 - 145 mmol/L 136  Potassium 3.5 - 5.1 mmol/L 3.6  Chloride 98 - 111 mmol/L 106  CO2 22 - 32 mmol/L 22  Calcium 8.9 - 10.3 mg/dL 8.4(L)  Total Protein 6.5 - 8.1 g/dL 6.4(L)  Total Bilirubin 0.3 - 1.2 mg/dL 0.6  Alkaline Phos 38 - 126 U/L 202(H)  AST 15 - 41 U/L 14(L)  ALT 0 - 44 U/L 13   Edinburgh Score: Edinburgh Postnatal Depression Scale Screening Tool  02/10/2021  I have been able to laugh and see the funny side of things. 0  I have looked forward with enjoyment to things. 0  I have blamed myself unnecessarily when things went wrong. 0  I have been anxious or worried for no good reason. 0  I have felt scared or panicky for no good reason. 0  Things have been getting on top of me. 0  I have been so unhappy that I have had difficulty sleeping. 0  I have felt sad or miserable. 0  I have been so unhappy that I have been crying. 0  The thought of harming myself has occurred to me. 0  Edinburgh Postnatal Depression Scale Total 0     After visit meds:  Allergies as of 02/11/2021       Reactions   Sulfa Antibiotics Other (See Comments)   Caused migraine        Medication List     STOP taking these medications    labetalol 200 MG tablet Commonly known as: NORMODYNE       TAKE these medications    acetaminophen 500 MG tablet Commonly known as: TYLENOL Take 2 tablets (1,000 mg total) by mouth every 6 (six) hours as needed for mild pain, moderate pain or headache.   aspirin 81 MG chewable tablet Chew 1 tablet (81 mg total) by mouth daily. What changed: when to take this   Blood Pressure Monitor Kit Please check blood pressure 1-2 weeks.   Blood Pressure Kit Devi 1 kit by Does not apply route once a week. Check Blood Pressure regularly and record readings into the Babyscripts App.  Large Cuff.  DX O90.0   cyclobenzaprine 10 MG tablet Commonly known as: FLEXERIL Take 1 tablet by mouth three times daily as needed for muscle spasm   docusate sodium 100 MG capsule Commonly known as: Colace Take 1 capsule (100 mg total) by mouth 2 (two) times daily.   enalapril 5 MG tablet Commonly known as: VASOTEC Take 1 tablet (5 mg total) by mouth daily. Start taking on: February 12, 2021   furosemide 20 MG tablet Commonly known as: LASIX Take 1 tablet (20 mg total) by mouth 2 (two) times daily for 5 days. Start taking on:  February 12, 2021   Gojji Weight Scale Misc 1 Device by Does not apply route once a week.   ibuprofen 600 MG tablet Commonly known as: ADVIL Take 1 tablet (600 mg total) by mouth every 6 (six) hours as needed for headache, mild pain or moderate pain.   Iron Polysacch Cmplx-B12-FA 150-0.025-1 MG Caps Take 1 capsule by mouth daily before breakfast.   NIFEdipine 90 MG 24 hr tablet Commonly known as: PROCARDIA XL/NIFEDICAL-XL Take 1 tablet (90 mg  total) by mouth daily. Start taking on: February 12, 2021   pantoprazole 40 MG tablet Commonly known as: Protonix Take 1 tablet (40 mg total) by mouth daily.   potassium chloride SA 20 MEQ tablet Commonly known as: KLOR-CON M Take 1 tablet (20 mEq total) by mouth 2 (two) times daily.   prenatal multivitamin Tabs tablet Take 1 tablet by mouth at bedtime.         Discharge home in stable condition Infant Disposition: CPS Discharge instruction: per After Visit Summary and Postpartum booklet. Activity: Advance as tolerated. Pelvic rest for 6 weeks.  Diet: routine diet Future Appointments: Future Appointments  Date Time Provider [place]  02/14/2021 10:20 AM [place] None  03/25/2021  1:10 PM Wood[place] Mode, MD [place] None   Follow up Visit:  [place] Follow up in 2 day(s).   Specialty: Obstetrics and Gynecology Why: You will be called with appointment date and time for BP check Contact information: [address] [place] [phone]                Message sent to [facility] by Dr Higinio Plan:   Please schedule this patient for a In person postpartum visit in 6 weeks with the following provider: Any provider. Additional Postpartum F/U:BP check 1 week  High risk pregnancy complicated by: HTN Delivery mode:  Vaginal, Spontaneous  Anticipated Birth Control:   nexplanon vs IUD   02/11/2021 Verita Schneiders, MD

## 2021-02-09 NOTE — Anesthesia Postprocedure Evaluation (Signed)
Anesthesia Post Note  Patient: Holly Bridges  Procedure(s) Performed: AN AD HOC LABOR EPIDURAL     Patient location during evaluation: Mother Baby Anesthesia Type: Epidural Level of consciousness: awake and alert Pain management: pain level controlled Vital Signs Assessment: post-procedure vital signs reviewed and stable Respiratory status: spontaneous breathing, nonlabored ventilation and respiratory function stable Cardiovascular status: stable Postop Assessment: no headache, no backache, epidural receding, no apparent nausea or vomiting, patient able to bend at knees, adequate PO intake and able to ambulate Anesthetic complications: no   No notable events documented.  Last Vitals:  Vitals:   02/09/21 0257 02/09/21 0627  BP: 140/76 (!) 142/72  Pulse: 66 73  Resp:    Temp: 36.9 C 37 C  SpO2: 98% 100%    Last Pain:  Vitals:   02/09/21 0627  TempSrc: Oral  PainSc:    Pain Goal: Patients Stated Pain Goal: 4 (02/09/21 0257)                 Laban Emperor

## 2021-02-09 NOTE — Plan of Care (Signed)
Oriented to room and routine.DTR's normal.Vaginal bleeding normal.

## 2021-02-10 MED ORDER — LABETALOL HCL 5 MG/ML IV SOLN: 80.0000 mg | INTRAVENOUS | Status: DC | PRN

## 2021-02-10 MED ORDER — HYDRALAZINE HCL 20 MG/ML IJ SOLN: 10.0000 mg | INTRAMUSCULAR | Status: DC | PRN

## 2021-02-10 MED ORDER — NIFEDIPINE ER OSMOTIC RELEASE 60 MG PO TB24
60.0000 mg | ORAL_TABLET | Freq: Two times a day (BID) | ORAL | Status: DC
  Administered 2021-02-10 – 2021-02-11 (×2): 60 mg via ORAL
  Filled 2021-02-10 (×2): qty 1

## 2021-02-10 MED ORDER — LABETALOL HCL 5 MG/ML IV SOLN: 40.0000 mg | INTRAVENOUS | Status: DC | PRN

## 2021-02-10 MED ORDER — LABETALOL HCL 5 MG/ML IV SOLN
20.0000 mg | INTRAVENOUS | Status: DC | PRN
  Administered 2021-02-11: 20 mg via INTRAVENOUS
  Filled 2021-02-10: qty 4

## 2021-02-10 NOTE — Clinical Social Work Maternal (Signed)
CLINICAL SOCIAL WORK MATERNAL/CHILD NOTE  Patient Details  Name: Holly Bridges MRN: [id number] Date of Birth: 1990-07-15  Date:  02/10/2021  Clinical Social Worker Initiating Note:  Holly Bridges Date/Time: Initiated:  02/10/21/1109     Child's Name:  Holly Bridges   Biological Parents:  Mother, Father   Need for Interpreter:  None   Reason for Referral:  Current Substance Use/Substance Use During Pregnancy   (hx of polysubstance use)   Address:  [place] [address]    Phone number:  [phone] (home)     Additional phone number: FOB's number is [phone]  Household Members/Support Persons (HM/SP):   Household Member/Support Person 1, Household Member/Support Person 4, Household Member/Support Person 2, Household Member/Support Person 3, Household Member/Support Person 5, Household Member/Support Person 6 (MOB has 5 older children that are not in her care.)   HM/SP Name Relationship DOB or Age  HM/SP -[place] 04/07/1980  HM/SP -2 Holly Bridges daughter 11/09/2005  HM/SP -3 Holly Bridges daughter 04/05/2013  HM/SP -4 Holly Bridges daughter 08/08/2017  HM/SP -5 Holly Bridges son 08/21/19  HM/SP -6 Holly Bridges daughter 08/21/19  HM/SP -7        HM/SP -8          Natural Supports (not living in the home):  Extended Family, Immediate Family, Parent (Per MOB, FOB's family will also provide support if needed)   Professional Supports: None   Employment: Unemployed   Type of Work:     Education:  Programmer, systems   Homebound arranged:    Museum/gallery curator Resources:  [employer]   Other Resources:   (CSW provided MOB with information to apply for Physicist, medical and [place].)   Cultural/Religious Considerations Which May Impact Care:  None reported  Strengths:  Ability to meet basic needs  , Lexicographer chosen, Home prepared for child     Psychotropic Medications:         Pediatrician:    [place]olic[place] area  Pediatrician  List:   [facility] for [place]      Pediatrician Fax Number:    Risk Factors/Current Problems:  Substance Use     Cognitive State:  Alert  , Insightful  , Goal Oriented     Mood/Affect:  Comfortable  , Interested  , Calm  , Flat  , Relaxed     CSW Assessment: CSW met with MOB in room 116 to complete an assessment for SA hx. When CSW arrived, infant was in the bed with MOB and FOB  and the couple was watching TV.  CSW explained CSW's role and invited the couple to ask questions.  The couple appeared supportive of one another and FOB was involved in the assessment while present.  With MOB's permission, CSW asked FOB to step out of the room in effort to respect MOB's privacy while assessing her for SA hx; FOB left without incident.   MOB acknowledged a hx of illicit substances use and reported her last use of cocaine "was 2-3 months ago."  MOB was unable to give a rational for using cocaine during pregnancy.  MOB denied having  a SA problem and declined resources for substance use help.   CSW made MOB aware that infant's and MOB's UDS were positive for Cocaine and Amphetamines. MOB continued to deny the use of illicit  substance that would result in a positive UDS for MOB and infant. CSW informed MOB that CSW will need to make a [place] CPS report and MOB was understanding.  MOB shared that she understands the hospital's policy and CPS process due to previous CPS hx after giving birth to MOB's twins in 2021. A report was made to [place] worker intake worker, Holly Bridges. At this time there are barriers to infant's discharge until CPS can make a safety disposition plan.   MOB reports having all essential items for infant including a bassinet and car seat (used but not expired).    CSW made RN and Peds NP aware of CPS report and infant's barriers to discharge.   CSW Plan/Description:  No  Further Intervention Required/No Barriers to Discharge, Sudden Infant Death Syndrome (SIDS) Education, Perinatal Mood and Anxiety Disorder (PMADs) Education, Other Patient/Family Education, Holly, Other Information/Referral to [employer], Child Copy Report  , CSW Awaiting CPS Disposition Plan, CSW Will Continue to Monitor Umbilical Cord Tissue Drug Screen Results and Make Report if Warranted   Holly Bridges, MSW, LCSW Clinical Social Work [phone]   Holly Nanas, LCSW 02/10/2021, 11:20 AM

## 2021-02-10 NOTE — Progress Notes (Signed)
CSW received a telephone call from [facility] CPS worker Rondall Allegra.  Per CPS there are barriers to infant's discharge.  CPS plans to meet with MOB and FOB on tomorrow (12/20) at 9:30am (at [facility]) to establish a safety disposition plan.  Peds NP was notified.   Blaine Hamper, MSW, LCSW Clinical Social Work [phone]

## 2021-02-10 NOTE — Progress Notes (Signed)
Post Partum Day 2 Subjective: no complaints, up ad lib, voiding, and tolerating PO  Objective: Blood pressure (!) 152/84, pulse 78, temperature 98.1 F (36.7 C), temperature source Oral, resp. rate 18, height 5\' 5"  (1.651 m), weight 72.6 kg, last menstrual period 04/28/2020, SpO2 99 %, unknown if currently breastfeeding.  Physical Exam:  General: alert, cooperative, and no distress Lochia: appropriate Uterine Fundus: firm Incision: n/a DVT Evaluation: No evidence of DVT seen on physical exam.  Recent Labs    02/08/21 1251  HGB 11.3*  HCT 35.1*    Assessment/Plan: Patient with persistent HTN postpartum day 2 - Procardia increased to 60 daily this morning. Afternoon BP remain elevated. Will increase to 60 mg q 12 hours. Continue lasix - Plan for discharge home tomorrow pending BP   LOS: 2 days   Holly Bridges 02/10/2021, 1:50 PM

## 2021-02-11 DIAGNOSIS — F1510 Other stimulant abuse, uncomplicated: Secondary | ICD-10-CM | POA: Diagnosis present

## 2021-02-11 MED ORDER — ENALAPRIL MALEATE 5 MG PO TABS: 5.0000 mg | ORAL_TABLET | Freq: Every day | ORAL | 2 refills | Status: DC

## 2021-02-11 MED ORDER — NIFEDIPINE ER OSMOTIC RELEASE 60 MG PO TB24: 90.0000 mg | ORAL_TABLET | Freq: Every day | ORAL | Status: DC

## 2021-02-11 MED ORDER — NIFEDIPINE ER OSMOTIC RELEASE 30 MG PO TB24
30.0000 mg | ORAL_TABLET | Freq: Once | ORAL | Status: AC
  Administered 2021-02-11: 16:00:00 30 mg via ORAL
  Filled 2021-02-11: qty 1

## 2021-02-11 MED ORDER — NIFEDIPINE ER OSMOTIC RELEASE 90 MG PO TB24: 90.0000 mg | ORAL_TABLET | Freq: Every day | ORAL | 2 refills | Status: DC

## 2021-02-11 MED ORDER — IBUPROFEN 600 MG PO TABS: 600.0000 mg | ORAL_TABLET | Freq: Four times a day (QID) | ORAL | 2 refills | Status: DC | PRN

## 2021-02-11 MED ORDER — ENALAPRIL MALEATE 5 MG PO TABS
5.0000 mg | ORAL_TABLET | Freq: Every day | ORAL | Status: DC
  Administered 2021-02-11: 18:00:00 5 mg via ORAL
  Filled 2021-02-11: qty 1

## 2021-02-11 MED ORDER — FUROSEMIDE 20 MG PO TABS: 20.0000 mg | ORAL_TABLET | Freq: Two times a day (BID) | ORAL | 0 refills | Status: AC

## 2021-02-11 MED ORDER — FUROSEMIDE 40 MG PO TABS: 20.0000 mg | ORAL_TABLET | Freq: Two times a day (BID) | ORAL | Status: DC

## 2021-02-11 NOTE — Progress Notes (Signed)
POSTPARTUM PROGRESS NOTE  PPD #3  Subjective:  Holly Bridges is a 30 y.o. [id number] s/p NSVD at [redacted]w[redacted]d. Today she notes that she is feeling anxious about her situation. She denies any problems with ambulating, voiding or po intake. Denies nausea or vomiting. She has passed flatus, +BM.  Pain is well controlled.  Lochia minimal Denies fever/chills/chest pain/SOB.  no HA, no blurry vision, noRUQ pain  Objective: Blood pressure (!) 155/107, pulse (!) 112, temperature 98.3 F (36.8 C), temperature source Oral, resp. rate 16, height 5\' 5"  (1.651 m), weight 72.6 kg, last menstrual period 04/28/2020, SpO2 100 %, unknown if currently breastfeeding.  Physical Exam:  General: alert, cooperative and no distress Chest: no respiratory distress Heart: regular rate and rhythm Abdomen: soft, nontender Uterine Fundus: firm, appropriately tender DVT Evaluation: No calf swelling or tenderness Extremities: no edema, no calf tenderness bilaterally Skin: warm, dry  No results found for this or any previous visit (from the past 24 hour(s)).  Assessment/Plan: Holly Bridges is a 30 y.o. 34 s/p NSVD at [redacted]w[redacted]d PPD#3 complicated by: 1) Preeclampsia with severe features  S/p Magnesium  Difficulty with BP control, required IV medication yesterday evening  Currently on Procardia Xl 60mg  bid and Lasix  May need to increase BP medication today  DISPO: Discharge dependent on blood pressure control- will watch closely today   LOS: 3 days   [redacted]w[redacted]d, DO Faculty Attending, [facility] 02/11/2021, 7:31 AM

## 2021-02-11 NOTE — Progress Notes (Signed)
CSW received a telephone call from [place] CPS worker (Markita Rainer [phone]).  Per CPS, infant will discharge to [facility] ([facility]  [facility] is aware to bring ID and car seat.  ° °Bedside RN was updated.  ° °Brisa Auth Boyd-Gilyard, MSW, LCSW °Clinical Social Work °[phone] ° °

## 2021-02-11 NOTE — Progress Notes (Signed)
Pt given discharge instructions and all questions answered. Pt verbalized understanding. IV discontinued.  

## 2021-02-12 ENCOUNTER — Other Ambulatory Visit: Payer: Self-pay | Admitting: Obstetrics and Gynecology

## 2021-02-12 ENCOUNTER — Telehealth: Payer: Self-pay

## 2021-02-12 DIAGNOSIS — M549 Dorsalgia, unspecified: Secondary | ICD-10-CM

## 2021-02-12 NOTE — Telephone Encounter (Signed)
Transition Care Management Unsuccessful Follow-up Telephone Call  Date of discharge and from where:  02/11/2021-[facility]   Attempts:  1st Attempt  Reason for unsuccessful TCM follow-up call:  Unable to leave message

## 2021-02-13 NOTE — Telephone Encounter (Signed)
Transition Care Management Follow-up Telephone Call Date of discharge and from where: 02/11/2021 from [facility] How have you been since you were released from the hospital? Pt stated that she is feeling well and did not have any questions or concerns at this time.  Any questions or concerns? No  Items Reviewed: Did the pt receive and understand the discharge instructions provided? Yes  Medications obtained and verified? Yes  Other? No  Any new allergies since your discharge? No  Dietary orders reviewed? No Do you have support at home? Yes   Functional Questionnaire: (I = Independent and D = Dependent) ADLs: I  Bathing/Dressing- I  Meal Prep- I  Eating- I  Maintaining continence- I  Transferring/Ambulation- I  Managing Meds- I   Follow up appointments reviewed:  PCP Hospital f/u appt confirmed? No   Specialist Hospital f/u appt confirmed? Yes  Scheduled to see Dr. Debroah Loop on 03/25/2021 @ 1:10pm. Are transportation arrangements needed? No  If their condition worsens, is the pt aware to call PCP or go to the Emergency Dept.? Yes Was the patient provided with contact information for the PCP's office or ED? Yes Was to pt encouraged to call back with questions or concerns? Yes

## 2021-02-14 ENCOUNTER — Ambulatory Visit: Payer: Medicaid Other

## 2021-02-20 ENCOUNTER — Telehealth (HOSPITAL_COMMUNITY): Payer: Self-pay | Admitting: *Deleted

## 2021-02-20 NOTE — Telephone Encounter (Signed)
Attempted Hospital Discharge Follow-Up Call.  No answer.  Unable to leave a message. 

## 2021-02-23 DIAGNOSIS — Z419 Encounter for procedure for purposes other than remedying health state, unspecified: Secondary | ICD-10-CM | POA: Diagnosis not present

## 2021-03-11 ENCOUNTER — Ambulatory Visit
Admission: RE | Admit: 2021-03-11 | Discharge: 2021-03-11 | Disposition: A | Discharge status: Other Acute Inpatient Hospital | Payer: Medicaid Other | Source: Ambulatory Visit

## 2021-03-11 ENCOUNTER — Other Ambulatory Visit: Payer: Self-pay

## 2021-03-11 NOTE — ED Triage Notes (Signed)
Pt reports having an abscess to left axilla region.  Started: a few weeks ago

## 2021-03-12 ENCOUNTER — Ambulatory Visit: Payer: Medicaid Other

## 2021-03-14 ENCOUNTER — Ambulatory Visit: Payer: Medicaid Other

## 2021-03-16 ENCOUNTER — Emergency Department (HOSPITAL_COMMUNITY)
Admission: EM | Admit: 2021-03-16 | Discharge: 2021-03-17 | Disposition: A | Discharge status: Home / Self Care | Payer: Medicaid Other | Attending: Emergency Medicine | Admitting: Emergency Medicine

## 2021-03-16 ENCOUNTER — Encounter (HOSPITAL_COMMUNITY): Payer: Self-pay

## 2021-03-16 ENCOUNTER — Ambulatory Visit (HOSPITAL_COMMUNITY): Payer: Medicaid Other

## 2021-03-16 DIAGNOSIS — Z5321 Procedure and treatment not carried out due to patient leaving prior to being seen by health care provider: Secondary | ICD-10-CM | POA: Diagnosis not present

## 2021-03-16 DIAGNOSIS — L02412 Cutaneous abscess of left axilla: Secondary | ICD-10-CM | POA: Diagnosis not present

## 2021-03-16 DIAGNOSIS — F17210 Nicotine dependence, cigarettes, uncomplicated: Secondary | ICD-10-CM | POA: Insufficient documentation

## 2021-03-16 LAB — BASIC METABOLIC PANEL
Anion gap: 9 (ref 5–15)
BUN: 12 mg/dL (ref 6–20)
CO2: 25 mmol/L (ref 22–32)
Calcium: 9 mg/dL (ref 8.9–10.3)
Chloride: 103 mmol/L (ref 98–111)
Creatinine, Ser: 1.15 mg/dL — ABNORMAL HIGH (ref 0.44–1.00)
GFR, Estimated: >60 mL/min (ref >60)
Glucose, Bld: 104 mg/dL — ABNORMAL HIGH (ref 70–99)
Potassium: 3.8 mmol/L (ref 3.5–5.1)
Sodium: 137 mmol/L (ref 135–145)

## 2021-03-16 LAB — CBC WITH DIFFERENTIAL/PLATELET
Abs Immature Granulocytes: 0.03 10*3/uL (ref 0.00–0.07)
Basophils Absolute: 0.1 10*3/uL (ref 0.0–0.1)
Basophils Relative: 1 %
Eosinophils Absolute: 0.2 10*3/uL (ref 0.0–0.5)
Eosinophils Relative: 2 %
HCT: 36.6 % (ref 36.0–46.0)
Hemoglobin: 11.6 g/dL — ABNORMAL LOW (ref 12.0–15.0)
Immature Granulocytes: 0 %
Lymphocytes Relative: 28 %
Lymphs Abs: 2.4 10*3/uL (ref 0.7–4.0)
MCH: 28.1 pg (ref 26.0–34.0)
MCHC: 31.7 g/dL (ref 30.0–36.0)
MCV: 88.6 fL (ref 80.0–100.0)
Monocytes Absolute: 0.5 10*3/uL (ref 0.1–1.0)
Monocytes Relative: 6 %
Neutro Abs: 5.3 10*3/uL (ref 1.7–7.7)
Neutrophils Relative %: 63 %
Platelets: 203 10*3/uL (ref 150–400)
RBC: 4.13 MIL/uL (ref 3.87–5.11)
RDW: 14.4 % (ref 11.5–15.5)
WBC: 8.4 10*3/uL (ref 4.0–10.5)
nRBC: 0 % (ref 0.0–0.2)

## 2021-03-16 NOTE — ED Triage Notes (Signed)
Pt arrives POV for eval of L axillary pain d/t abscess. Pt reports onset 3-4 days ago, seen at [facility] but did not wait to see MD. Several abscesses noted to the axillary region, one large one as well. Pt w some red streaking down L arm

## 2021-03-16 NOTE — ED Provider Triage Note (Signed)
Emergency Medicine Provider Triage Evaluation Note  Holly Bridges , a 31 y.o. female  was evaluated in triage.  Pt complains of abscess to left axilla onset 3-4 days.  She denies a history of similar symptoms.  Patient smokes cigarettes however denies IV drug use.  Has associated resolved nausea.  Has tried warm compresses to the area.  Denies fever, chills, vomiting.  Review of Systems  Positive: As per HPI above. Negative: Fever, chills  Physical Exam  BP (!) 156/112 (BP Location: Right Arm)    Pulse 76    Temp 97.8 F (36.6 C) (Oral)    Resp 16    Ht 5\' 5"  (1.651 m)    Wt 73 kg    LMP 04/28/2020    SpO2 100%    BMI 26.78 kg/m  Gen:   Awake, no distress   Resp:  Normal effort  MSK:   Moves extremities without difficulty  Other:  Multiple areas of induration noted to the left axilla.  No drainage on exam.  Mild fluctuance noted to largest area.  Mild erythema noted to anterior left shoulder streaking from left axilla.  Medical Decision Making  Medically screening exam initiated at 6:12 PM.  Appropriate orders placed.  Holly Bridges was informed that the remainder of the evaluation will be completed by another provider, this initial triage assessment does not replace that evaluation, and the importance of remaining in the ED until their evaluation is complete.   Jasten Guyette A, PA-C 03/16/21 1818

## 2021-03-17 ENCOUNTER — Ambulatory Visit: Payer: Medicaid Other

## 2021-03-17 ENCOUNTER — Other Ambulatory Visit: Payer: Self-pay

## 2021-03-17 ENCOUNTER — Ambulatory Visit
Admission: EM | Admit: 2021-03-17 | Discharge: 2021-03-17 | Disposition: A | Discharge status: Home / Self Care | Payer: Medicaid Other | Attending: Emergency Medicine | Admitting: Emergency Medicine

## 2021-03-17 VITALS — BP 154/98 | HR 78 | Temp 98.5°F | Resp 20

## 2021-03-17 DIAGNOSIS — L03112 Cellulitis of left axilla: Secondary | ICD-10-CM

## 2021-03-17 DIAGNOSIS — L02412 Cutaneous abscess of left axilla: Secondary | ICD-10-CM

## 2021-03-17 DIAGNOSIS — L732 Hidradenitis suppurativa: Secondary | ICD-10-CM

## 2021-03-17 MED ORDER — CEPHALEXIN 500 MG PO CAPS: 500.0000 mg | ORAL_CAPSULE | Freq: Four times a day (QID) | ORAL | 0 refills | Status: DC

## 2021-03-17 NOTE — ED Provider Notes (Signed)
[facility]    CSN: [id number] Arrival date & time: 03/17/21  1123    HISTORY   Chief Complaint  Patient presents with   Abscess   Appointment   HPI Holly Bridges is a 31 y.o. female. Patient complains of an abscess under her left arm which began about a month ago, patient has been to the urgent care and emergency room for this issue but left both times before being seen.  Most recent ED visit was yesterday, RN reported there was red streaking down her left arm, patient eloped after initial triage performed by PA.  Patient states that the redness and swelling on the underside of her left upper arm persist, states the lesion on her arm is very painful, is having difficulty putting her arm down, has to hold it out to avoid pain and pressure.  Patient states she is had multiple lesions like this in the past, but has never had one get this red, irritated and painful.  Patient states she attempted multiple times to pop and squeeze the lesion with minor success, states the end result was that the lesion became even more red and more painful.  Patient states she regularly shaves the hair under her arms and also uses deodorant daily.  Patient states she is been on the Internet, has done some research about these types of lesions but is not sure what they are or why she has them.  The history is provided by the patient.  Past Medical History:  Diagnosis Date   Anemia    Hypertension affecting pregnancy 06/20/2019   Patient Active Problem List   Diagnosis Date Noted   Amphetamine abuse ([place]) 02/11/2021   Chronic hypertension with superimposed pre-eclampsia 02/09/2021   Cocaine use complicating pregnancy 62/56/3893   Insufficient prenatal care 02/09/2021   Unwanted fertility 12/24/2020   Chronic hypertension affecting pregnancy 12/24/2020   History of prior pregnancy with IUGR newborn 12/10/2020   Cocaine abuse ([place]) 12/01/2020   Hx of preeclampsia, prior pregnancy, currently  pregnant, third trimester 11/26/2020   Back pain affecting pregnancy in third trimester 11/26/2020   Supervision of other high risk pregnancy, antepartum 08/27/2020   Hypertension in pregnancy, severe preeclampsia, delivered 06/20/2019   Rubella non-immune status, antepartum 05/11/2019   Heart murmur 05/11/2019   Supervision of high risk pregnancy, antepartum 05/10/2019   Tobacco smoking complicating pregnancy, third trimester 05/10/2019   Uses marijuana 05/10/2019   Late prenatal care 05/10/2019   History of retained placenta 05/10/2019   History of preterm premature rupture of membranes (PPROM) 05/10/2019   Past Surgical History:  Procedure Laterality Date   adnoids     DILATION AND CURETTAGE OF UTERUS     TONSILLECTOMY     OB History     Gravida  5   Para  5   Term  3   Preterm  2   AB      Living  5      SAB      IAB      Ectopic      Multiple  1   Live Births  6          Home Medications    Prior to Admission medications   Medication Sig Start Date End Date Taking? Authorizing Provider  acetaminophen (TYLENOL) 500 MG tablet Take 2 tablets (1,000 mg total) by mouth every 6 (six) hours as needed for mild pain, moderate pain or headache. 06/23/19   Anyanwu, Sallyanne Havers, MD  aspirin 81 MG chewable tablet Chew 1 tablet (81 mg total) by mouth daily. Patient taking differently: Chew 81 mg by mouth at bedtime. 11/26/20   Griffin Basil, MD  Blood Pressure Monitor KIT Please check blood pressure 1-2 weeks. 05/10/19   Nugent, Gerrie Nordmann, NP  Blood Pressure Monitoring (BLOOD PRESSURE KIT) DEVI 1 kit by Does not apply route once a week. Check Blood Pressure regularly and record readings into the [employer] App.  Large Cuff.  DX O90.0 08/28/20   Shelly Bombard, MD  furosemide (LASIX) 20 MG tablet Take 1 tablet (20 mg total) by mouth 2 (two) times daily for 5 days. 02/12/21 02/17/21  Anyanwu, Sallyanne Havers, MD  ibuprofen (ADVIL) 600 MG tablet Take 1 tablet (600 mg total) by  mouth every 6 (six) hours as needed for headache, mild pain or moderate pain. 02/11/21   Osborne Oman, MD  Iron Polysacch Cmplx-B12-FA 150-0.025-1 MG CAPS Take 1 capsule by mouth daily before breakfast. Patient not taking: Reported on 02/10/2021 10/22/20   Shelly Bombard, MD  labetalol (NORMODYNE) 100 MG tablet Take 100 mg by mouth 2 (two) times daily.    [provider]  Misc. Devices (GOJJI WEIGHT SCALE) MISC 1 Device by Does not apply route once a week. 08/28/20   Shelly Bombard, MD  pantoprazole (PROTONIX) 40 MG tablet Take 1 tablet (40 mg total) by mouth daily. 10/22/20   Shelly Bombard, MD  Prenatal Vit-Fe Fumarate-FA (PRENATAL MULTIVITAMIN) TABS tablet Take 1 tablet by mouth at bedtime.    [provider]    Family History Family History  Problem Relation Age of Onset   Cancer Maternal Grandfather    Social History Social History   Tobacco Use   Smoking status: Every Day    Packs/day: 0.50    Years: 5.00    Pack years: 2.50    Types: Cigarettes   Smokeless tobacco: Never   Tobacco comments:    1/2-1 pack/day  Vaping Use   Vaping Use: Never used  Substance Use Topics   Alcohol use: No   Drug use: Not Currently    Frequency: 1.0 times per week    Types: Marijuana    Comment: patient states last used marijuana 02/24/2020 as of 01/02/2021   Allergies   Sulfa antibiotics  Review of Systems Review of Systems Pertinent findings noted in history of present illness.   Physical Exam Triage Vital Signs ED Triage Vitals  Enc Vitals Group     BP 12/20/20 0827 (!) 147/82     Pulse Rate 12/20/20 0827 72     Resp 12/20/20 0827 18     Temp 12/20/20 0827 98.3 F (36.8 C)     Temp Source 12/20/20 0827 Oral     SpO2 12/20/20 0827 98 %     Weight --      Height --      Head Circumference --      Peak Flow --      Pain Score 12/20/20 0826 5     Pain Loc --      Pain Edu? --      Excl. in [place]? --   No data found.  Updated Vital Signs BP (!)  154/98 (BP Location: Right Arm)    Pulse 78    Temp 98.5 F (36.9 C) (Oral)    Resp 20    LMP 03/13/2021 (Approximate)    SpO2 98%    Breastfeeding No   Physical Exam  Vitals and nursing note reviewed.  Constitutional:      General: She is not in acute distress.    Appearance: Normal appearance. She is not ill-appearing.  HENT:     Head: Normocephalic and atraumatic.  Eyes:     General: Lids are normal.        Right eye: No discharge.        Left eye: No discharge.     Extraocular Movements: Extraocular movements intact.     Conjunctiva/sclera: Conjunctivae normal.     Right eye: Right conjunctiva is not injected.     Left eye: Left conjunctiva is not injected.  Neck:     Trachea: Trachea and phonation normal.  Cardiovascular:     Rate and Rhythm: Normal rate and regular rhythm.     Pulses: Normal pulses.     Heart sounds: Normal heart sounds. No murmur heard.   No friction rub. No gallop.  Pulmonary:     Effort: Pulmonary effort is normal. No accessory muscle usage, prolonged expiration or respiratory distress.     Breath sounds: Normal breath sounds. No stridor, decreased air movement or transmitted upper airway sounds. No decreased breath sounds, wheezing, rhonchi or rales.  Chest:     Chest wall: No tenderness.  Musculoskeletal:        General: Normal range of motion.     Cervical back: Normal range of motion and neck supple. Normal range of motion.  Lymphadenopathy:     Cervical: No cervical adenopathy.  Skin:    General: Skin is warm and dry.     Findings: No erythema or rash.     Comments: Multiple lesions of hidradenitis suppurativa beneath left axilla, no significant scarring appreciated.  The largest of the lesions appears to be comprised of multiple small lesions that have coalesced, tunneled beneath the skin, there are 3 punctates visible and the predominant area of concern.  Largest patient is erythematous, has mild purulent drainage from 3 separate punctate's, is  diffusely tender to palpation, mildly fluctuant in the center but otherwise firm, surrounding tissue is indurated.  There is also erythema and swelling streaking down the posterior aspect of the left upper arm.    Neurological:     General: No focal deficit present.     Mental Status: She is alert and oriented to person, place, and time.  Psychiatric:        Mood and Affect: Mood normal.        Behavior: Behavior normal.    Visual Acuity Right Eye Distance:   Left Eye Distance:   Bilateral Distance:    Right Eye Near:   Left Eye Near:    Bilateral Near:     [facility] / Diagnostics / Procedures:    EKG  Radiology No results found.  Procedures Incision and Drainage  Date/Time: 03/17/2021 12:03 PM Performed by: Lynden Oxford Scales, PA-C Authorized by: Lynden Oxford Scales, PA-C   Consent:    Consent obtained:  Verbal   Consent given by:  Patient   Risks, benefits, and alternatives were discussed: yes     Risks discussed:  Bleeding, incomplete drainage, pain and infection   Alternatives discussed:  No treatment, delayed treatment, alternative treatment, observation and referral Universal protocol:    Procedure explained and questions answered to patient or proxy's satisfaction: yes     Patient identity confirmed:  Verbally with patient Location:    Type:  Abscess   Size:  4   Location:  Upper extremity  Upper extremity location: Left axilla. Pre-procedure details:    Skin preparation:  Povidone-iodine Sedation:    Sedation type:  None Anesthesia:    Anesthesia method:  Local infiltration   Local anesthetic: Xylocaine 2% with epi. Procedure type:    Complexity:  Simple Procedure details:    Ultrasound guidance: no     Needle aspiration: no     Incision types:  Stab incision   Incision depth:  Subcutaneous   Wound management:  Probed and deloculated   Drainage:  Bloody and purulent   Drainage amount:  Copious   Wound treatment:  Drain placed   Packing  materials:  1/4 in iodoform gauze   Amount 1/4" iodoform:  2 in Post-procedure details:    Procedure completion:  Tolerated well, no immediate complications (including critical care time)  UC Diagnoses / Final Clinical Impressions(s)   I have reviewed the triage vital signs and the nursing notes.  Pertinent labs & imaging results that were available during my care of the patient were reviewed by me and considered in my medical decision making (see chart for details).    Final diagnoses:  Abscess of left axilla  Cellulitis of axilla, left  Axillary hidradenitis suppurativa   Patient provided with prescription for Keflex, advised to advance packing by 1/2 inch daily until completely removed, keep wound covered while draining.  Patient advised to reach out to dermatology to see if she can get an appointment, have initiated a request to help patient find a primary care provider in case she needs a referral.  Patient also advised to monitor the wound for worsening signs of infection, report to the ED or return to urgent care for reevaluation if any appear.  Patient with partner today, patient and her partner verbalized understanding of instructions.  ED Prescriptions     Medication Sig Dispense Auth. Provider   cephALEXin (KEFLEX) 500 MG capsule Take 1 capsule (500 mg total) by mouth 4 (four) times daily. 20 capsule Lynden Oxford Scales, PA-C      PDMP not reviewed this encounter.  Pending results:  Labs Reviewed - No data to display  Medications Ordered in UC: Medications - No data to display  Disposition Upon Discharge:  Condition: stable for discharge home Home: take medications as prescribed; routine discharge instructions as discussed; follow up as advised.  Patient presented with an acute illness with associated systemic symptoms and significant discomfort requiring urgent management. In my opinion, this is a condition that a prudent lay person (someone who possesses an average  knowledge of health and medicine) may potentially expect to result in complications if not addressed urgently such as respiratory distress, impairment of bodily function or dysfunction of bodily organs.   Routine symptom specific, illness specific and/or disease specific instructions were discussed with the patient and/or caregiver at length.   As such, the patient has been evaluated and assessed, work-up was performed and treatment was provided in alignment with urgent care protocols and evidence based medicine.  Patient/parent/caregiver has been advised that the patient may require follow up for further testing and treatment if the symptoms continue in spite of treatment, as clinically indicated and appropriate.  If the patient was tested for COVID-19, Influenza and/or RSV, then the patient/parent/guardian was advised to isolate at home pending the results of his/her diagnostic coronavirus test and potentially longer if theyre positive. I have also advised pt that if his/her COVID-19 test returns positive, it's recommended to self-isolate for at least 10 days after symptoms first  appeared AND until fever-free for 24 hours without fever reducer AND other symptoms have improved or resolved. Discussed self-isolation recommendations as well as instructions for household member/close contacts as per the [facility] and [place] DHHS, and also gave patient the [place] packet with this information.  Patient/parent/caregiver has been advised to return to the [facility] or PCP in 3-5 days if no better; to PCP or the Emergency Department if new signs and symptoms develop, or if the current signs or symptoms continue to change or worsen for further workup, evaluation and treatment as clinically indicated and appropriate  The patient will follow up with their current PCP if and as advised. If the patient does not currently have a PCP we will assist them in obtaining one.   The patient may need specialty follow up if the symptoms  continue, in spite of conservative treatment and management, for further workup, evaluation, consultation and treatment as clinically indicated and appropriate.   Patient/parent/caregiver verbalized understanding and agreement of plan as discussed.  All questions were addressed during visit.  Please see discharge instructions below for further details of plan.  Discharge Instructions:   Discharge Instructions      All of the purulent material in the abscess under your left arm has been removed.  Because the wound is fairly deep, I have left a drain in place.  During daily dressing changes, please gently pull about one half inch of the drainage material from the wound and trim the end, leaving about an inch and a half visible outside of the wound.  Once the drain has been completely removed, the wound will be well on its way to healing from the inside.  You can decide whether or not to keep the wound covered based on how much it continues to drain, this can take a few more days.  For the infection, please begin Keflex, 1 capsule 4 times daily for the next 5 days.  This will effectively eradicate any bacteria that is causing infection in this wound and the surrounding skin tissue.  Please also continue to monitor the wound for increased signs of redness, increasing pain or increasing purulent drainage.  Please report back to either urgent care or go to the emergency room if any of these signs occur.  I have initiated a request to assist you in finding a primary care provider.  It is very important that you have regular follow-up with a dermatologist for your hidradenitis suppurativa.  There are really good interventional therapies that can prevent these lesions from becoming large and irritating like this one did.  I have also included the contact information for to local dermatologist that you can also reach out to just in case your insurance does not require referral to a specialist.  Thank you  for visiting urgent care today.  Appreciate opportunity to participate in your care.        This office note has been dictated using Museum/gallery curator.  Unfortunately, and despite my best efforts, this method of dictation can sometimes lead to occasional typographical or grammatical errors.  I apologize in advance if this occurs.     Lynden Oxford Scales, PA-C 03/17/21 1305

## 2021-03-17 NOTE — Discharge Instructions (Addendum)
All of the purulent material in the abscess under your left arm has been removed.  Because the wound is fairly deep, I have left a drain in place.  During daily dressing changes, please gently pull about one half inch of the drainage material from the wound and trim the end, leaving about an inch and a half visible outside of the wound.  Once the drain has been completely removed, the wound will be well on its way to healing from the inside.  You can decide whether or not to keep the wound covered based on how much it continues to drain, this can take a few more days.  For the infection, please begin Keflex, 1 capsule 4 times daily for the next 5 days.  This will effectively eradicate any bacteria that is causing infection in this wound and the surrounding skin tissue.  Please also continue to monitor the wound for increased signs of redness, increasing pain or increasing purulent drainage.  Please report back to either urgent care or go to the emergency room if any of these signs occur.  I have initiated a request to assist you in finding a primary care provider.  It is very important that you have regular follow-up with a dermatologist for your hidradenitis suppurativa.  There are really good interventional therapies that can prevent these lesions from becoming large and irritating like this one did.  I have also included the contact information for to local dermatologist that you can also reach out to just in case your insurance does not require referral to a specialist.  Thank you for visiting urgent care today.  Appreciate opportunity to participate in your care.

## 2021-03-17 NOTE — ED Triage Notes (Signed)
Pt reports having an abscess to left axilla. Started: a month ago

## 2021-03-17 NOTE — ED Notes (Signed)
Patient seen leaving ED and throwing blood pressure cuff in the trash. When asked if she was leaving patient gave no response

## 2021-03-18 ENCOUNTER — Telehealth: Payer: Self-pay

## 2021-03-18 NOTE — Telephone Encounter (Signed)
Transition Care Management Unsuccessful Follow-up Telephone Call  Date of discharge and from where:  03/17/2021 from [facility]  Attempts:  1st Attempt  Reason for unsuccessful TCM follow-up call:  Unable to leave message

## 2021-03-19 NOTE — Telephone Encounter (Signed)
Transition Care Management Unsuccessful Follow-up Telephone Call  Date of discharge and from where:  03/17/2021 from [facility]  Attempts:  2nd Attempt  Reason for unsuccessful TCM follow-up call:  Unable to leave message

## 2021-03-20 NOTE — Telephone Encounter (Signed)
Transition Care Management Unsuccessful Follow-up Telephone Call  Date of discharge and from where:  03/17/2021[facility]   Attempts:  3rd Attempt  Reason for unsuccessful TCM follow-up call:  Unable to leave message

## 2021-03-25 ENCOUNTER — Ambulatory Visit: Payer: Medicaid Other | Admitting: Obstetrics & Gynecology

## 2021-03-26 DIAGNOSIS — Z419 Encounter for procedure for purposes other than remedying health state, unspecified: Secondary | ICD-10-CM | POA: Diagnosis not present

## 2021-04-23 DIAGNOSIS — Z419 Encounter for procedure for purposes other than remedying health state, unspecified: Secondary | ICD-10-CM | POA: Diagnosis not present

## 2021-04-29 ENCOUNTER — Inpatient Hospital Stay: Admission: RE | Admit: 2021-04-29 | Payer: Medicaid Other | Source: Ambulatory Visit

## 2021-05-24 DIAGNOSIS — Z419 Encounter for procedure for purposes other than remedying health state, unspecified: Secondary | ICD-10-CM | POA: Diagnosis not present

## 2021-06-23 DIAGNOSIS — Z419 Encounter for procedure for purposes other than remedying health state, unspecified: Secondary | ICD-10-CM | POA: Diagnosis not present

## 2021-07-24 DIAGNOSIS — Z419 Encounter for procedure for purposes other than remedying health state, unspecified: Secondary | ICD-10-CM | POA: Diagnosis not present

## 2021-08-23 DIAGNOSIS — Z419 Encounter for procedure for purposes other than remedying health state, unspecified: Secondary | ICD-10-CM | POA: Diagnosis not present

## 2021-09-23 DIAGNOSIS — Z419 Encounter for procedure for purposes other than remedying health state, unspecified: Secondary | ICD-10-CM | POA: Diagnosis not present

## 2021-10-19 DIAGNOSIS — Z3A15 15 weeks gestation of pregnancy: Secondary | ICD-10-CM | POA: Diagnosis not present

## 2021-10-19 DIAGNOSIS — O2302 Infections of kidney in pregnancy, second trimester: Secondary | ICD-10-CM | POA: Diagnosis not present

## 2021-10-19 DIAGNOSIS — R109 Unspecified abdominal pain: Secondary | ICD-10-CM | POA: Diagnosis not present

## 2021-10-19 DIAGNOSIS — Z743 Need for continuous supervision: Secondary | ICD-10-CM | POA: Diagnosis not present

## 2021-10-19 DIAGNOSIS — R110 Nausea: Secondary | ICD-10-CM | POA: Diagnosis not present

## 2021-10-19 DIAGNOSIS — G8929 Other chronic pain: Secondary | ICD-10-CM | POA: Diagnosis not present

## 2021-10-19 DIAGNOSIS — R1031 Right lower quadrant pain: Secondary | ICD-10-CM | POA: Diagnosis not present

## 2021-10-19 DIAGNOSIS — N1330 Unspecified hydronephrosis: Secondary | ICD-10-CM | POA: Diagnosis not present

## 2021-10-19 DIAGNOSIS — Z3A16 16 weeks gestation of pregnancy: Secondary | ICD-10-CM | POA: Diagnosis not present

## 2021-10-19 DIAGNOSIS — O26892 Other specified pregnancy related conditions, second trimester: Secondary | ICD-10-CM | POA: Diagnosis not present

## 2021-10-21 DIAGNOSIS — R1031 Right lower quadrant pain: Secondary | ICD-10-CM | POA: Diagnosis not present

## 2021-10-21 DIAGNOSIS — Z743 Need for continuous supervision: Secondary | ICD-10-CM | POA: Diagnosis not present

## 2021-10-21 DIAGNOSIS — M549 Dorsalgia, unspecified: Secondary | ICD-10-CM | POA: Diagnosis not present

## 2021-10-24 DIAGNOSIS — Z419 Encounter for procedure for purposes other than remedying health state, unspecified: Secondary | ICD-10-CM | POA: Diagnosis not present

## 2021-11-05 ENCOUNTER — Ambulatory Visit: Payer: Medicaid Other

## 2021-11-05 ENCOUNTER — Telehealth: Payer: Medicaid Other | Admitting: Physician Assistant

## 2021-11-05 NOTE — Progress Notes (Signed)
The patient no-showed for appointment despite this provider sending direct link, reaching out via phone with no response and waiting for at least 10 minutes from appointment time for patient to join. They will be marked as a NS for this appointment/time.   Message was sent to patient earlier to make her aware of need of in-person evaluation giving her concerns  Piedad Climes, PA-C

## 2021-11-06 ENCOUNTER — Ambulatory Visit: Payer: Medicaid Other

## 2021-11-23 DIAGNOSIS — Z419 Encounter for procedure for purposes other than remedying health state, unspecified: Secondary | ICD-10-CM | POA: Diagnosis not present

## 2021-12-24 DIAGNOSIS — Z419 Encounter for procedure for purposes other than remedying health state, unspecified: Secondary | ICD-10-CM | POA: Diagnosis not present

## 2022-01-20 DIAGNOSIS — R109 Unspecified abdominal pain: Secondary | ICD-10-CM | POA: Diagnosis not present

## 2022-01-20 DIAGNOSIS — O0933 Supervision of pregnancy with insufficient antenatal care, third trimester: Secondary | ICD-10-CM | POA: Diagnosis not present

## 2022-01-20 DIAGNOSIS — Z3A29 29 weeks gestation of pregnancy: Secondary | ICD-10-CM | POA: Diagnosis not present

## 2022-01-20 DIAGNOSIS — Z0371 Encounter for suspected problem with amniotic cavity and membrane ruled out: Secondary | ICD-10-CM | POA: Diagnosis not present

## 2022-01-20 DIAGNOSIS — O26893 Other specified pregnancy related conditions, third trimester: Secondary | ICD-10-CM | POA: Diagnosis not present

## 2022-01-23 DIAGNOSIS — Z419 Encounter for procedure for purposes other than remedying health state, unspecified: Secondary | ICD-10-CM | POA: Diagnosis not present

## 2022-01-29 DIAGNOSIS — Z363 Encounter for antenatal screening for malformations: Secondary | ICD-10-CM | POA: Diagnosis not present

## 2022-01-29 DIAGNOSIS — O3680X0 Pregnancy with inconclusive fetal viability, not applicable or unspecified: Secondary | ICD-10-CM | POA: Diagnosis not present

## 2022-01-29 DIAGNOSIS — Z3A30 30 weeks gestation of pregnancy: Secondary | ICD-10-CM | POA: Diagnosis not present

## 2022-01-29 DIAGNOSIS — Z3689 Encounter for other specified antenatal screening: Secondary | ICD-10-CM | POA: Diagnosis not present

## 2022-01-29 DIAGNOSIS — R87610 Atypical squamous cells of undetermined significance on cytologic smear of cervix (ASC-US): Secondary | ICD-10-CM | POA: Diagnosis not present

## 2022-02-10 DIAGNOSIS — Z131 Encounter for screening for diabetes mellitus: Secondary | ICD-10-CM | POA: Diagnosis not present

## 2022-03-06 DIAGNOSIS — O10913 Unspecified pre-existing hypertension complicating pregnancy, third trimester: Secondary | ICD-10-CM | POA: Diagnosis not present

## 2022-03-06 DIAGNOSIS — O10919 Unspecified pre-existing hypertension complicating pregnancy, unspecified trimester: Secondary | ICD-10-CM | POA: Diagnosis not present

## 2022-03-06 DIAGNOSIS — O0930 Supervision of pregnancy with insufficient antenatal care, unspecified trimester: Secondary | ICD-10-CM | POA: Diagnosis not present

## 2022-03-06 DIAGNOSIS — Z3689 Encounter for other specified antenatal screening: Secondary | ICD-10-CM | POA: Diagnosis not present

## 2022-03-06 DIAGNOSIS — O99013 Anemia complicating pregnancy, third trimester: Secondary | ICD-10-CM | POA: Diagnosis not present

## 2022-03-06 DIAGNOSIS — O09293 Supervision of pregnancy with other poor reproductive or obstetric history, third trimester: Secondary | ICD-10-CM | POA: Diagnosis not present

## 2022-03-06 DIAGNOSIS — O99019 Anemia complicating pregnancy, unspecified trimester: Secondary | ICD-10-CM | POA: Diagnosis not present

## 2022-03-06 DIAGNOSIS — D649 Anemia, unspecified: Secondary | ICD-10-CM | POA: Diagnosis not present

## 2022-03-06 DIAGNOSIS — Z3A35 35 weeks gestation of pregnancy: Secondary | ICD-10-CM | POA: Diagnosis not present

## 2022-03-13 DIAGNOSIS — Z3689 Encounter for other specified antenatal screening: Secondary | ICD-10-CM | POA: Diagnosis not present

## 2022-03-13 DIAGNOSIS — Z3A36 36 weeks gestation of pregnancy: Secondary | ICD-10-CM | POA: Diagnosis not present

## 2022-03-13 DIAGNOSIS — B182 Chronic viral hepatitis C: Secondary | ICD-10-CM | POA: Diagnosis not present

## 2022-12-25 DEATH — deceased
